# Patient Record
Sex: Female | Born: 1955 | Race: White | Hispanic: No | Marital: Married | Smoking: Never smoker
Health system: Southern US, Community
[De-identification: ages and names within clinical notes are randomized; demographics above are authoritative.]

## PROBLEM LIST (undated history)

## (undated) DIAGNOSIS — K219 Gastro-esophageal reflux disease without esophagitis: Secondary | ICD-10-CM

## (undated) DIAGNOSIS — K5792 Diverticulitis of intestine, part unspecified, without perforation or abscess without bleeding: Secondary | ICD-10-CM

## (undated) DIAGNOSIS — R7309 Other abnormal glucose: Secondary | ICD-10-CM

## (undated) DIAGNOSIS — E785 Hyperlipidemia, unspecified: Secondary | ICD-10-CM

## (undated) DIAGNOSIS — G479 Sleep disorder, unspecified: Secondary | ICD-10-CM

## (undated) DIAGNOSIS — M1990 Unspecified osteoarthritis, unspecified site: Secondary | ICD-10-CM

## (undated) DIAGNOSIS — N950 Postmenopausal bleeding: Secondary | ICD-10-CM

## (undated) DIAGNOSIS — I10 Essential (primary) hypertension: Secondary | ICD-10-CM

## (undated) DIAGNOSIS — F419 Anxiety disorder, unspecified: Secondary | ICD-10-CM

## (undated) DIAGNOSIS — N898 Other specified noninflammatory disorders of vagina: Secondary | ICD-10-CM

## (undated) HISTORY — PX: REPLACEMENT TOTAL KNEE BILATERAL: SUR1225

## (undated) HISTORY — DX: Other abnormal glucose: R73.09

## (undated) HISTORY — DX: Sleep disorder, unspecified: G47.9

## (undated) HISTORY — DX: Postmenopausal bleeding: N95.0

## (undated) HISTORY — PX: TUBAL LIGATION: SHX77

## (undated) HISTORY — DX: Hyperlipidemia, unspecified: E78.5

## (undated) HISTORY — DX: Other specified noninflammatory disorders of vagina: N89.8

## (undated) HISTORY — PX: TONSILLECTOMY: SUR1361

## (undated) HISTORY — DX: Essential (primary) hypertension: I10

## (undated) HISTORY — DX: Diverticulitis of intestine, part unspecified, without perforation or abscess without bleeding: K57.92

---

## 2002-08-31 ENCOUNTER — Emergency Department (HOSPITAL_COMMUNITY): Admission: EM | Admit: 2002-08-31 | Discharge: 2002-08-31 | Payer: Self-pay | Admitting: Emergency Medicine

## 2007-02-13 ENCOUNTER — Ambulatory Visit (HOSPITAL_COMMUNITY): Admission: RE | Admit: 2007-02-13 | Discharge: 2007-02-13 | Payer: Self-pay | Admitting: Family Medicine

## 2007-09-16 ENCOUNTER — Ambulatory Visit (HOSPITAL_COMMUNITY): Admission: RE | Admit: 2007-09-16 | Discharge: 2007-09-16 | Payer: Self-pay | Admitting: Endocrinology

## 2007-09-29 ENCOUNTER — Other Ambulatory Visit: Admission: RE | Admit: 2007-09-29 | Discharge: 2007-09-29 | Payer: Self-pay | Admitting: Obstetrics and Gynecology

## 2007-11-05 ENCOUNTER — Ambulatory Visit (HOSPITAL_COMMUNITY): Admission: RE | Admit: 2007-11-05 | Discharge: 2007-11-05 | Payer: Self-pay | Admitting: Internal Medicine

## 2007-11-05 ENCOUNTER — Ambulatory Visit: Payer: Self-pay | Admitting: Internal Medicine

## 2008-07-15 ENCOUNTER — Emergency Department (HOSPITAL_COMMUNITY): Admission: EM | Admit: 2008-07-15 | Discharge: 2008-07-15 | Payer: Self-pay | Admitting: Emergency Medicine

## 2008-11-26 ENCOUNTER — Emergency Department (HOSPITAL_COMMUNITY): Admission: EM | Admit: 2008-11-26 | Discharge: 2008-11-26 | Payer: Self-pay | Admitting: Emergency Medicine

## 2009-01-09 ENCOUNTER — Ambulatory Visit (HOSPITAL_COMMUNITY): Admission: RE | Admit: 2009-01-09 | Discharge: 2009-01-09 | Payer: Self-pay | Admitting: Internal Medicine

## 2009-09-20 ENCOUNTER — Other Ambulatory Visit: Admission: RE | Admit: 2009-09-20 | Discharge: 2009-09-20 | Payer: Self-pay | Admitting: Obstetrics and Gynecology

## 2009-11-01 ENCOUNTER — Ambulatory Visit (HOSPITAL_COMMUNITY): Admission: RE | Admit: 2009-11-01 | Discharge: 2009-11-01 | Payer: Self-pay | Admitting: Obstetrics & Gynecology

## 2010-04-29 ENCOUNTER — Encounter: Payer: Self-pay | Admitting: Endocrinology

## 2010-04-30 ENCOUNTER — Encounter: Payer: Self-pay | Admitting: Endocrinology

## 2010-07-14 LAB — CBC
HCT: 42.3 % (ref 36.0–46.0)
MCHC: 35.3 g/dL (ref 30.0–36.0)
RBC: 4.84 MIL/uL (ref 3.87–5.11)
RDW: 13.3 % (ref 11.5–15.5)
WBC: 13.7 10*3/uL — ABNORMAL HIGH (ref 4.0–10.5)

## 2010-07-14 LAB — BASIC METABOLIC PANEL
Calcium: 9.7 mg/dL (ref 8.4–10.5)
GFR calc non Af Amer: >60 mL/min (ref >60)
Glucose, Bld: 130 mg/dL — ABNORMAL HIGH (ref 70–99)
Potassium: 3.6 mEq/L (ref 3.5–5.1)
Sodium: 134 mEq/L — ABNORMAL LOW (ref 135–145)

## 2010-07-14 LAB — DIFFERENTIAL
Basophils Absolute: 0 10*3/uL (ref 0.0–0.1)
Eosinophils Relative: 0 % (ref 0–5)
Lymphs Abs: 0.9 10*3/uL (ref 0.7–4.0)
Neutrophils Relative %: 86 % — ABNORMAL HIGH (ref 43–77)

## 2010-08-24 NOTE — Op Note (Signed)
NAME:  Courtney Marshall, Courtney Marshall               ACCOUNT NO.:  [id number]   MEDICAL RECORD NO.:  [id number]          PATIENT TYPE:  AMB   LOCATION:  DAY                           FACILITY:  [facility]   PHYSICIAN:  R. Roetta Sessions, M.D. DATE OF BIRTH:  Apr 06, 1956   DATE OF PROCEDURE:  DATE OF DISCHARGE:                               OPERATIVE REPORT   Courtney Marshall is a 55 year old lady sent over out of the courtesy of Dr.  Assunta Found for colorectal cancer and she has never had lower GI tract  evaluated.  There is no family history of colorectal neoplasia.  She is  devoid of any lower GI tract symptoms.  Colonoscopy is now being done  with a screening maneuver.  Risks, benefits, alternatives, and  limitations have been reviewed, questions answered, all parties  agreeable.  Please see documentation in the medical record.   PROCEDURE NOTE:  Oxygen saturation, blood pressure, and pulse of the  patient monitored throughout the entire procedure.   CONSCIOUS SEDATION:  Versed 7 mg , IV Demerol 100 mg IV in divided  doses.   INSTRUMENT:  Pentax video chip system.   FINDINGS:  Digital rectal exam revealed no abnormalities.  Endoscopic  findings:  The prep was adequate.  Colon:  Colonic mucosa was surveyed  from the rectosigmoid junction through the left transverse, right colon,  appendiceal orifice, ileocecal valve, and cecum.  These structures were  well seen and photographed for the record.  From this level, the scope  was slowly and cautiously withdrawn.  All previously mentioned mucosal  surfaces were again seen.  The patient did have a left-sided  diverticulum and colonic mucosa appeared normal.  The scope was pulled  down the rectum with thorough examination of the rectal mucosa including  retroflex view of the anal verge demonstrated no abnormalities. The  patient tolerated the procedure well and was reacted in endoscopy.   IMPRESSION:  Normal rectum, left-sided diverticulum, and colonic mucosa  appeared normal.   RECOMMENDATIONS:  Repeat screening colonoscopy in 10 years.      Jonathon Bellows, M.D.  Electronically Signed     RMR/MEDQ  D:  12/07/2007  T:  12/08/2007  Job:  [id number]   cc:   Corrie Mckusick, M.D.  Fax: [phone]

## 2011-02-14 ENCOUNTER — Other Ambulatory Visit: Payer: Self-pay | Admitting: Dermatology

## 2012-02-28 ENCOUNTER — Other Ambulatory Visit: Payer: Self-pay | Admitting: Dermatology

## 2012-03-11 ENCOUNTER — Other Ambulatory Visit (HOSPITAL_COMMUNITY)
Admission: RE | Admit: 2012-03-11 | Discharge: 2012-03-11 | Disposition: A | Discharge status: Home / Self Care | Payer: BC Managed Care – PPO | Source: Ambulatory Visit | Attending: Obstetrics and Gynecology | Admitting: Obstetrics and Gynecology

## 2012-03-11 ENCOUNTER — Other Ambulatory Visit: Payer: Self-pay | Admitting: Adult Health

## 2012-03-11 DIAGNOSIS — Z1151 Encounter for screening for human papillomavirus (HPV): Secondary | ICD-10-CM | POA: Insufficient documentation

## 2012-03-11 DIAGNOSIS — Z01419 Encounter for gynecological examination (general) (routine) without abnormal findings: Secondary | ICD-10-CM | POA: Insufficient documentation

## 2013-10-21 ENCOUNTER — Other Ambulatory Visit: Payer: Self-pay | Admitting: Adult Health

## 2013-10-25 ENCOUNTER — Encounter: Payer: Self-pay | Admitting: Adult Health

## 2013-10-25 ENCOUNTER — Ambulatory Visit (INDEPENDENT_AMBULATORY_CARE_PROVIDER_SITE_OTHER): Payer: BC Managed Care – PPO | Admitting: Adult Health

## 2013-10-25 VITALS — BP 150/80 | HR 78 | Ht 64.0 in | Wt 185.0 lb

## 2013-10-25 DIAGNOSIS — Z1212 Encounter for screening for malignant neoplasm of rectum: Secondary | ICD-10-CM

## 2013-10-25 DIAGNOSIS — Z01419 Encounter for gynecological examination (general) (routine) without abnormal findings: Secondary | ICD-10-CM

## 2013-10-25 LAB — HEMOCCULT GUIAC POC 1CARD (OFFICE): FECAL OCCULT BLD: NEGATIVE

## 2013-10-25 NOTE — Progress Notes (Addendum)
Patient ID: Norville HaggardCathy S Tiller, female   DOB: [birth year], 58 y.o.   MRN: [id number] History of Present Illness: Lynden AngCathy is a [age] year old white female, married in for a physical,she had a normal pap with negative HPV 03/11/12.She has had decrease libido and saw [facility] in [place] and started an estradiol patch and Prometrium and feels better,still with vaginal dryness.   Current Medications, Allergies, Past Medical History, Past Surgical History, Family History and Social History were reviewed in [name] electronic medical record.     Review of Systems: Patient denies any headaches, blurred vision, shortness of breath, chest pain, abdominal pain, problems with bowel movements, urination, or joint pain or mood swings.See HPI    Physical Exam:BP 150/80  Pulse 78  Ht 5\' 4"  (1.626 m)  Wt 185 lb (83.915 kg)  BMI 31.74 kg/m2 General:  Well developed, well nourished, no acute distress Skin:  Warm and dry Neck:  Midline trachea, normal thyroid Lungs; Clear to auscultation bilaterally Breast:  No dominant palpable mass, retraction, or nipple discharge Cardiovascular: Regular rate and rhythm Abdomen:  Soft, non tender, no hepatosplenomegaly Pelvic:  External genitalia is normal in appearance.  The vagina is pale with decrease rugae and moisture. The cervix is atrophic.  Uterus is felt to be normal size, shape, and contour.  No                adnexal masses or tenderness noted. Rectal: Good sphincter tone, no polyps, or hemorrhoids felt.  Hemoccult negative. Extremities:  No swelling or varicosities noted Psych:  No mood changes, alert and cooperative,seems happy She had labs recently and is awaiting results.Had [place] mamm[place] in [place].   Impression: Yearly gyn exam no pap    Plan: Physical and pap in 1 year Mammogram yearly Colonoscopy 2019 Try Luvena for vaginal moisture Review handouts on menopause and HRT

## 2013-10-25 NOTE — Patient Instructions (Signed)
Hormone Therapy At menopause, your body begins making less estrogen and progesterone hormones. This causes the body to stop having menstrual periods. This is because estrogen and progesterone hormones control your periods and menstrual cycle. A lack of estrogen may cause symptoms such as:  Hot flushes (or hot flashes).  Vaginal dryness.  Dry skin.  Loss of sex drive.  Risk of bone loss (osteoporosis). When this happens, you may choose to take hormone therapy to get back the estrogen lost during menopause. When the hormone estrogen is given alone, it is usually referred to as ET (Estrogen Therapy). When the hormone progestin is combined with estrogen, it is generally called HT (Hormone Therapy). This was formerly known as hormone replacement therapy (HRT). Your caregiver can help you make a decision on what will be best for you. The decision to use HT seems to change often as new studies are done. Many studies do not agree on the benefits of hormone replacement therapy. LIKELY BENEFITS OF HT INCLUDE PROTECTION FROM:  Hot Flushes (also called hot flashes) - A hot flush is a sudden feeling of heat that spreads over the face and body. The skin may redden like a blush. It is connected with sweats and sleep disturbance. Women going through menopause may have hot flushes a few times a month or several times per day depending on the woman.  Osteoporosis (bone loss)- Estrogen helps guard against bone loss. After menopause, a woman's bones slowly lose calcium and become weak and brittle. As a result, bones are more likely to break. The hip, wrist, and spine are affected most often. Hormone therapy can help slow bone loss after menopause. Weight bearing exercise and taking calcium with vitamin D also can help prevent bone loss. There are also medications that your caregiver can prescribe that can help prevent osteoporosis.  Vaginal Dryness - Loss of estrogen causes changes in the vagina. Its lining may  become thin and dry. These changes can cause pain and bleeding during sexual intercourse. Dryness can also lead to infections. This can cause burning and itching. (Vaginal estrogen treatment can help relieve pain, itching, and dryness.)  Urinary Tract Infections are more common after menopause because of lack of estrogen. Some women also develop urinary incontinence because of low estrogen levels in the vagina and bladder.  Possible other benefits of estrogen include a positive effect on mood and short-term memory in women. RISKS AND COMPLICATIONS  Using estrogen alone without progesterone causes the lining of the uterus to grow. This increases the risk of lining of the uterus (endometrial) cancer. Your caregiver should give another hormone called progestin if you have a uterus.  Women who take combined (estrogen and progestin) HT appear to have an increased risk of breast cancer. The risk appears to be small, but increases throughout the time that HT is taken.  Combined therapy also makes the breast tissue slightly denser which makes it harder to read mammograms (breast X-rays).  Combined, estrogen and progesterone therapy can be taken together every day, in which case there may be spotting of blood. HT therapy can be taken cyclically in which case you will have menstrual periods. Cyclically means HT is taken for a set amount of days, then not taken, then this process is repeated.  HT may increase the risk of stroke, heart attack, breast cancer and forming blood clots in your leg.  Transdermal estrogen (estrogen that is absorbed through the skin with a patch or a cream) may have more positive results with:    Cholesterol.  Blood pressure.  Blood clots. Having the following conditions may indicate you should not have HT:  Endometrial cancer.  Liver disease.  Breast cancer.  Heart disease.  History of blood clots.  Stroke. TREATMENT   If you choose to take HT and have a uterus,  usually estrogen and progestin are prescribed.  Your caregiver will help you decide the best way to take the medications.  Possible ways to take estrogen include:  Pills.  Patches.  Gels.  Sprays.  Vaginal estrogen cream, rings and tablets.  It is best to take the lowest dose possible that will help your symptoms and take them for the shortest period of time that you can.  Hormone therapy can help relieve some of the problems (symptoms) that affect women at menopause. Before making a decision about HT, talk to your caregiver about what is best for you. Be well informed and comfortable with your decisions. HOME CARE INSTRUCTIONS   Follow your caregivers advice when taking the medications.  A Pap test is done to screen for cervical cancer.  The first Pap test should be done at age 21.  Between ages 21 and 29, Pap tests are repeated every 2 years.  Beginning at age 30, you are advised to have a Pap test every 3 years as long as your past 3 Pap tests have been normal.  Some women have medical problems that increase the chance of getting cervical cancer. Talk to your caregiver about these problems. It is especially important to talk to your caregiver if a new problem develops soon after your last Pap test. In these cases, your caregiver may recommend more frequent screening and Pap tests.  The above recommendations are the same for women who have or have not gotten the vaccine for HPV (Human Papillomavirus).  If you had a hysterectomy for a problem that was not a cancer or a condition that could lead to cancer, then you no longer need Pap tests. However, even if you no longer need a Pap test, a regular exam is a good idea to make sure no other problems are starting.   If you are between ages 65 and 70, and you have had normal Pap tests going back 10 years, you no longer need Pap tests. However, even if you no longer need a Pap test, a regular exam is a good idea to make sure no  other problems are starting.   If you have had past treatment for cervical cancer or a condition that could lead to cancer, you need Pap tests and screening for cancer for at least 20 years after your treatment.  If Pap tests have been discontinued, risk factors (such as a new sexual partner) need to be re-assessed to determine if screening should be resumed.  Some women may need screenings more often if they are at high risk for cervical cancer.  Get mammograms done as per the advice of your caregiver. SEEK IMMEDIATE MEDICAL CARE IF:  You develop abnormal vaginal bleeding.  You have pain or swelling in your legs, shortness of breath, or chest pain.  You develop dizziness or headaches.  You have lumps or changes in your breasts or armpits.  You have slurred speech.  You develop weakness or numbness of your arms or legs.  You have pain, burning, or bleeding when urinating.  You develop abdominal pain. Document Released: 12/22/2002 Document Revised: 06/17/2011 Document Reviewed: 04/11/2010 ExitCare Patient Information 2015 [employer]. This information is not intended to   replace advice given to you by your health care provider. Make sure you discuss any questions you have with your health care provider. Menopause Menopause is the normal time of life when menstrual periods stop completely. Menopause is complete when you have missed 12 consecutive menstrual periods. It usually occurs between the ages of 1 years and 58 years. Very rarely does a woman develop menopause before the age of 42 years. At menopause, your ovaries stop producing the female hormones estrogen and progesterone. This can cause undesirable symptoms and also affect your health. Sometimes the symptoms may occur 4-5 years before the menopause begins. There is no relationship between menopause and:  Oral contraceptives.  Number of children you had.  Race.  The age your menstrual periods started  (menarche). Heavy smokers and very thin women may develop menopause earlier in life. CAUSES  The ovaries stop producing the female hormones estrogen and progesterone.  Other causes include:  Surgery to remove both ovaries.  The ovaries stop functioning for no known reason.  Tumors of the pituitary gland in the brain.  Medical disease that affects the ovaries and hormone production.  Radiation treatment to the abdomen or pelvis.  Chemotherapy that affects the ovaries. SYMPTOMS   Hot flashes.  Night sweats.  Decrease in sex drive.  Vaginal dryness and thinning of the vagina causing painful intercourse.  Dryness of the skin and developing wrinkles.  Headaches.  Tiredness.  Irritability.  Memory problems.  Weight gain.  Bladder infections.  Hair growth of the face and chest.  Infertility. More serious symptoms include:  Loss of bone (osteoporosis) causing breaks (fractures).  Depression.  Hardening and narrowing of the arteries (atherosclerosis) causing heart attacks and strokes. DIAGNOSIS   When the menstrual periods have stopped for 12 straight months.  Physical exam.  Hormone studies of the blood. TREATMENT  There are many treatment choices and nearly as many questions about them. The decisions to treat or not to treat menopausal changes is an individual choice made with your health care provider. Your health care provider can discuss the treatments with you. Together, you can decide which treatment will work best for you. Your treatment choices may include:   Hormone therapy (estrogen and progesterone).  Non-hormonal medicines.  Treating the individual symptoms with medicine (for example antidepressants for depression).  Herbal medicines that may help specific symptoms.  Counseling by a psychiatrist or psychologist.  Group therapy.  Lifestyle changes including:  Eating healthy.  Regular exercise.  Limiting caffeine and  alcohol.  Stress management and meditation.  No treatment. HOME CARE INSTRUCTIONS   Take the medicine your health care provider gives you as directed.  Get plenty of sleep and rest.  Exercise regularly.  Eat a diet that contains calcium (good for the bones) and soy products (acts like estrogen hormone).  Avoid alcoholic beverages.  Do not smoke.  If you have hot flashes, dress in layers.  Take supplements, calcium, and vitamin D to strengthen bones.  You can use over-the-counter lubricants or moisturizers for vaginal dryness.  Group therapy is sometimes very helpful.  Acupuncture may be helpful in some cases. SEEK MEDICAL CARE IF:   You are not sure you are in menopause.  You are having menopausal symptoms and need advice and treatment.  You are still having menstrual periods after age 50 years.  You have pain with intercourse.  Menopause is complete (no menstrual period for 12 months) and you develop vaginal bleeding.  You need a referral to a specialist (  gynecologist, psychiatrist, or psychologist) for treatment. SEEK IMMEDIATE MEDICAL CARE IF:   You have severe depression.  You have excessive vaginal bleeding.  You fell and think you have a broken bone.  You have pain when you urinate.  You develop leg or chest pain.  You have a fast pounding heart beat (palpitations).  You have severe headaches.  You develop vision problems.  You feel a lump in your breast.  You have abdominal pain or severe indigestion. Document Released: 06/15/2003 Document Revised: 11/25/2012 Document Reviewed: 10/22/2012 [facility] Patient Information 2015 [employer] [place]. This information is not intended to replace advice given to you by your health care provider. Make sure you discuss any questions you have with your health care provider. Physical in 1 year Mammogram yearly Colonoscopy 2019

## 2014-02-07 ENCOUNTER — Encounter: Payer: Self-pay | Admitting: Adult Health

## 2014-06-30 ENCOUNTER — Encounter (HOSPITAL_COMMUNITY): Payer: Self-pay | Admitting: Emergency Medicine

## 2014-06-30 ENCOUNTER — Emergency Department (HOSPITAL_COMMUNITY)
Admission: EM | Admit: 2014-06-30 | Discharge: 2014-06-30 | Disposition: A | Discharge status: Home / Self Care | Payer: BC Managed Care – PPO | Attending: Emergency Medicine | Admitting: Emergency Medicine

## 2014-06-30 ENCOUNTER — Emergency Department (HOSPITAL_COMMUNITY): Payer: BC Managed Care – PPO

## 2014-06-30 DIAGNOSIS — I10 Essential (primary) hypertension: Secondary | ICD-10-CM | POA: Diagnosis not present

## 2014-06-30 DIAGNOSIS — R42 Dizziness and giddiness: Secondary | ICD-10-CM | POA: Insufficient documentation

## 2014-06-30 DIAGNOSIS — Z79899 Other long term (current) drug therapy: Secondary | ICD-10-CM | POA: Diagnosis not present

## 2014-06-30 DIAGNOSIS — R51 Headache: Secondary | ICD-10-CM | POA: Insufficient documentation

## 2014-06-30 DIAGNOSIS — Z8639 Personal history of other endocrine, nutritional and metabolic disease: Secondary | ICD-10-CM | POA: Insufficient documentation

## 2014-06-30 DIAGNOSIS — R55 Syncope and collapse: Secondary | ICD-10-CM | POA: Diagnosis present

## 2014-06-30 LAB — CBC WITH DIFFERENTIAL/PLATELET
BASOS ABS: 0 10*3/uL (ref 0.0–0.1)
BASOS PCT: 1 % (ref 0–1)
EOS ABS: 0.3 10*3/uL (ref 0.0–0.7)
EOS PCT: 5 % (ref 0–5)
HEMATOCRIT: 42.7 % (ref 36.0–46.0)
Hemoglobin: 14.6 g/dL (ref 12.0–15.0)
Lymphocytes Relative: 38 % (ref 12–46)
Lymphs Abs: 2.3 10*3/uL (ref 0.7–4.0)
MCH: 30.5 pg (ref 26.0–34.0)
MCHC: 34.2 g/dL (ref 30.0–36.0)
MCV: 89.1 fL (ref 78.0–100.0)
MONO ABS: 0.6 10*3/uL (ref 0.1–1.0)
Monocytes Relative: 10 % (ref 3–12)
Neutro Abs: 2.8 10*3/uL (ref 1.7–7.7)
Neutrophils Relative %: 46 % (ref 43–77)
PLATELETS: 222 10*3/uL (ref 150–400)
RBC: 4.79 MIL/uL (ref 3.87–5.11)
RDW: 12.7 % (ref 11.5–15.5)
WBC: 6 10*3/uL (ref 4.0–10.5)

## 2014-06-30 LAB — COMPREHENSIVE METABOLIC PANEL
ALK PHOS: 62 U/L (ref 39–117)
ALT: 23 U/L (ref 0–35)
ANION GAP: 9 (ref 5–15)
AST: 23 U/L (ref 0–37)
Albumin: 4.4 g/dL (ref 3.5–5.2)
BILIRUBIN TOTAL: 0.4 mg/dL (ref 0.3–1.2)
BUN: 16 mg/dL (ref 6–23)
CALCIUM: 9.6 mg/dL (ref 8.4–10.5)
CHLORIDE: 104 mmol/L (ref 96–112)
CO2: 27 mmol/L (ref 19–32)
CREATININE: 0.6 mg/dL (ref 0.50–1.10)
GFR calc Af Amer: >90 mL/min (ref >90)
GFR calc non Af Amer: >90 mL/min (ref >90)
GLUCOSE: 112 mg/dL — AB (ref 70–99)
Potassium: 3.3 mmol/L — ABNORMAL LOW (ref 3.5–5.1)
Sodium: 140 mmol/L (ref 135–145)
Total Protein: 7.4 g/dL (ref 6.0–8.3)

## 2014-06-30 LAB — CBG MONITORING, ED: GLUCOSE-CAPILLARY: 106 mg/dL — AB (ref 70–99)

## 2014-06-30 MED ORDER — MECLIZINE HCL 12.5 MG PO TABS
25.0000 mg | ORAL_TABLET | Freq: Once | ORAL | Status: AC
  Administered 2014-06-30: 25 mg via ORAL
  Filled 2014-06-30: qty 2

## 2014-06-30 MED ORDER — MECLIZINE HCL 25 MG PO TABS: 25.0000 mg | ORAL_TABLET | Freq: Three times a day (TID) | ORAL | Status: DC | PRN

## 2014-06-30 MED ORDER — ONDANSETRON HCL 4 MG/2ML IJ SOLN
4.0000 mg | Freq: Once | INTRAMUSCULAR | Status: AC
  Administered 2014-06-30: 4 mg via INTRAVENOUS
  Filled 2014-06-30: qty 2

## 2014-06-30 MED ORDER — MORPHINE SULFATE 4 MG/ML IJ SOLN
4.0000 mg | Freq: Once | INTRAMUSCULAR | Status: DC
Start: 2014-06-30 — End: 2014-06-30
  Filled 2014-06-30: qty 1

## 2014-06-30 NOTE — ED Notes (Signed)
PT c/o sudden onset of dizziness with a syncopal episode and fell onto the grass today at 1115. PT c/o headache at this time and dizziness with movement.

## 2014-06-30 NOTE — ED Notes (Signed)
Patient ambulatory to restroom, steady gait.

## 2014-06-30 NOTE — ED Notes (Signed)
Patient with no complaints at this time. Respirations even and unlabored. Skin warm/dry. Discharge instructions reviewed with patient at this time. Patient given opportunity to voice concerns/ask questions. IV removed per policy and band-aid applied to site. Patient discharged at this time and left Emergency Department with steady gait.  

## 2014-06-30 NOTE — ED Provider Notes (Signed)
CSN: [id number]     Arrival date & time 06/30/14  1245 History   First MD Initiated Contact with Patient 06/30/14 1319     Chief Complaint  Patient presents with  . Near Syncope     HPI  His residual evaluation of a dizzy spell. She is a Runner, broadcasting/film/video. She was walking outside of school with [employer]. Shows sudden onset of severe spinning sensation. She developed on the ground "on my butt". Mild nausea. Her symptoms improved. She sat on the ground. The school "safety team" put her in a wheelchair. She was taken to the office. Her husband came to get her and brought her to the hospital. She's been minimally symptomatic since leaving school. No past episodes of vertigo. She's had a headache each day for the last year or 2. She attributes this to "stress".  Past Medical History  Diagnosis Date  . Hypertension   . Hyperlipidemia    Past Surgical History  Procedure Laterality Date  . Cesarean section    . Tubal ligation    . Tonsillectomy     Family History  Problem Relation Age of Onset  . Osteoporosis Mother   . Heart disease Father   . Hypertension Father   . Hyperlipidemia Father   . Osteoporosis Sister   . Osteoporosis Maternal Grandmother   . Other Maternal Grandfather     hit by train  . Heart attack Paternal Grandfather    History  Substance Use Topics  . Smoking status: Never Smoker   . Smokeless tobacco: Never Used  . Alcohol Use: No   OB History    Gravida Para Term Preterm AB TAB SAB Ectopic Multiple Living   Review of Systems  Constitutional: Negative for fever, chills, diaphoresis, appetite change and fatigue.  HENT: Negative for mouth sores, sore throat and trouble swallowing.   Eyes: Negative for visual disturbance.  Respiratory: Negative for cough, chest tightness, shortness of breath and wheezing.   Cardiovascular: Negative for chest pain.  Gastrointestinal: Negative for nausea, vomiting, abdominal pain, diarrhea and abdominal distention.   Endocrine: Negative for polydipsia, polyphagia and polyuria.  Genitourinary: Negative for dysuria, frequency and hematuria.  Musculoskeletal: Negative for gait problem.  Skin: Negative for color change, pallor and rash.  Neurological: Positive for dizziness and headaches. Negative for syncope and light-headedness.  Hematological: Does not bruise/bleed easily.  Psychiatric/Behavioral: Negative for behavioral problems and confusion.      Allergies  Lisinopril  Home Medications   Prior to Admission medications   Medication Sig Start Date End Date Taking? Authorizing Provider  buPROPion (WELLBUTRIN XL) 150 MG 24 hr tablet 150 mg. 06/20/14  Yes Historical Provider, MD  losartan-hydrochlorothiazide (HYZAAR) 100-25 MG per tablet  09/25/13  Yes Historical Provider, MD   BP 135/63 mmHg  Pulse 78  Temp(Src) 98.1 F (36.7 C) (Oral)  Resp 18  Ht  (1.626 m)  Wt 185 lb (83.915 kg)  BMI 31.74 kg/m2  SpO2 96% Physical Exam  Constitutional: She is oriented to person, place, and time. She appears well-developed and well-nourished. No distress.  HENT:  Head: Normocephalic.  Eyes: Conjunctivae are normal. Pupils are equal, round, and reactive to light. No scleral icterus.  Neck: Normal range of motion. Neck supple. No thyromegaly present.  Cardiovascular: Normal rate and regular rhythm.  Exam reveals no gallop and no friction rub.   No murmur heard. Pulmonary/Chest: Effort normal and breath sounds normal.  No respiratory distress. She has no wheezes. She has no rales.  Abdominal: Soft. Bowel sounds are normal. She exhibits no distension. There is no tenderness. There is no rebound.  Musculoskeletal: Normal range of motion.  Neurological: She is alert and oriented to person, place, and time.  She states with changing positions or moving her head she gets "a few" episodes where she thought she is spinning. No demonstrable nystagmus. Normal cranial nerves per normal peripheral strength and  sensation and gait.  Skin: Skin is warm and dry. No rash noted.  Psychiatric: She has a normal mood and affect. Her behavior is normal.    ED Course  Procedures (including critical care time) Labs Review Labs Reviewed  COMPREHENSIVE METABOLIC PANEL - Abnormal; Notable for the following:    Potassium 3.3 (*)    Glucose, Bld 112 (*)    All other components within normal limits  CBG MONITORING, ED - Abnormal; Notable for the following:    Glucose-Capillary 106 (*)    All other components within normal limits  CBC WITH DIFFERENTIAL/PLATELET    Imaging Review No results found.   EKG Interpretation   Date/Time:  Thursday June 30 2014 13:00:16 EDT Ventricular Rate:  74 PR Interval:  135 QRS Duration: 95 QT Interval:  380 QTC Calculation: 422 R Axis:   6 Text Interpretation:  Sinus rhythm Low voltage, precordial leads Confirmed  by Fayrene FearingJAMES  MD, Briany Aye ([id number]) on 06/30/2014 2:44:37 PM      MDM   Final diagnoses:  Vertigo    Patient minimally symptomatically. Mild headache. Vertigo only with movements. She is a history of hypertension but is controlled with medications. Despite markedly hypertensive here. Nonsmoker. Has a family history of suturable aneurysm or AVM. No neck pain or injury to suggest dissection.  This was an episode of acute peripheral vertigo.  She has headache today. However, she's had a headache she states everyday for about the last year or 2. Has total) neurological findings on exam. No acute findings on CT scan. I think she would be appropriate for outpatient treatment. Vertigo precautions. Meclizine. Take meclizine until 24 hours of symptoms. No driving until 24 hours without symptoms. Primary care follow-up.    Rolland PorterMark Donnavin Vandenbrink, MD 06/30/14 [phone]

## 2014-06-30 NOTE — Discharge Instructions (Signed)
Take meclizine/Antivert until 24 hours without any dizziness. No driving until 24 hours without symptoms. Follow-up with your primary care physician.   Benign Positional Vertigo Vertigo means you feel like you or your surroundings are moving when they are not. Benign positional vertigo is the most common form of vertigo. Benign means that the cause of your condition is not serious. Benign positional vertigo is more common in older adults. CAUSES  Benign positional vertigo is the result of an upset in the labyrinth system. This is an area in the middle ear that helps control your balance. This may be caused by a viral infection, head injury, or repetitive motion. However, often no specific cause is found. SYMPTOMS  Symptoms of benign positional vertigo occur when you move your head or eyes in different directions. Some of the symptoms may include:  Loss of balance and falls.  Vomiting.  Blurred vision.  Dizziness.  Nausea.  Involuntary eye movements (nystagmus). DIAGNOSIS  Benign positional vertigo is usually diagnosed by physical exam. If the specific cause of your benign positional vertigo is unknown, your caregiver may perform imaging tests, such as magnetic resonance imaging (MRI) or computed tomography (CT). TREATMENT  Your caregiver may recommend movements or procedures to correct the benign positional vertigo. Medicines such as meclizine, benzodiazepines, and medicines for nausea may be used to treat your symptoms. In rare cases, if your symptoms are caused by certain conditions that affect the inner ear, you may need surgery. HOME CARE INSTRUCTIONS   Follow your caregiver's instructions.  Move slowly. Do not make sudden body or head movements.  Avoid driving.  Avoid operating heavy machinery.  Avoid performing any tasks that would be dangerous to you or others during a vertigo episode.  Drink enough fluids to keep your urine clear or pale yellow. SEEK IMMEDIATE MEDICAL  CARE IF:   You develop problems with walking, weakness, numbness, or using your arms, hands, or legs.  You have difficulty speaking.  You develop severe headaches.  Your nausea or vomiting continues or gets worse.  You develop visual changes.  Your family or friends notice any behavioral changes.  Your condition gets worse.  You have a fever.  You develop a stiff neck or sensitivity to light. MAKE SURE YOU:   Understand these instructions.  Will watch your condition.  Will get help right away if you are not doing well or get worse. Document Released: 12/31/2005 Document Revised: 06/17/2011 Document Reviewed: 12/13/2010 [facility] Patient Information 2015 [employer] [place]. This information is not intended to replace advice given to you by your health care provider. Make sure you discuss any questions you have with your health care provider.

## 2014-06-30 NOTE — ED Notes (Signed)
MD at bedside. 

## 2014-06-30 NOTE — ED Notes (Signed)
Swallow screen done before given PO medication. Passed with no difficulty.

## 2014-11-03 ENCOUNTER — Other Ambulatory Visit (HOSPITAL_COMMUNITY)
Admission: RE | Admit: 2014-11-03 | Discharge: 2014-11-03 | Disposition: A | Discharge status: Home / Self Care | Payer: BC Managed Care – PPO | Source: Ambulatory Visit | Attending: Adult Health | Admitting: Adult Health

## 2014-11-03 ENCOUNTER — Ambulatory Visit (INDEPENDENT_AMBULATORY_CARE_PROVIDER_SITE_OTHER): Payer: BC Managed Care – PPO | Admitting: Adult Health

## 2014-11-03 ENCOUNTER — Encounter: Payer: Self-pay | Admitting: Adult Health

## 2014-11-03 VITALS — BP 122/76 | HR 80 | Ht 64.0 in | Wt 186.0 lb

## 2014-11-03 DIAGNOSIS — Z01419 Encounter for gynecological examination (general) (routine) without abnormal findings: Secondary | ICD-10-CM | POA: Insufficient documentation

## 2014-11-03 DIAGNOSIS — Z1151 Encounter for screening for human papillomavirus (HPV): Secondary | ICD-10-CM | POA: Insufficient documentation

## 2014-11-03 DIAGNOSIS — N950 Postmenopausal bleeding: Secondary | ICD-10-CM

## 2014-11-03 DIAGNOSIS — G479 Sleep disorder, unspecified: Secondary | ICD-10-CM

## 2014-11-03 DIAGNOSIS — Z1211 Encounter for screening for malignant neoplasm of colon: Secondary | ICD-10-CM | POA: Diagnosis not present

## 2014-11-03 DIAGNOSIS — N898 Other specified noninflammatory disorders of vagina: Secondary | ICD-10-CM

## 2014-11-03 HISTORY — DX: Sleep disorder, unspecified: G47.9

## 2014-11-03 HISTORY — DX: Postmenopausal bleeding: N95.0

## 2014-11-03 HISTORY — DX: Other specified noninflammatory disorders of vagina: N89.8

## 2014-11-03 LAB — HEMOCCULT GUIAC POC 1CARD (OFFICE): Fecal Occult Blood, POC: NEGATIVE

## 2014-11-03 NOTE — Patient Instructions (Signed)
Physical in  1 year Mammogram yearly  Return 8/8 for gyn [place] Colonoscopy next year Try melatonin at bedtime  Try luvena for moisture Use astroglide with sex

## 2014-11-03 NOTE — Progress Notes (Signed)
Patient ID: Courtney Marshall, female   DOB: Aug 19, 1955, 59 y.o.   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 PCP is Dr Phillips Odor.  Current Medications, Allergies, Past Medical History, Past Surgical History, Family History and Social History were reviewed in [employer] record.     Review of Systems: Patient denies any headaches, hearing loss, fatigue, blurred vision, shortness of breath, chest pain, abdominal pain, problems with bowel movements, urination, or intercourse. No joint pain or mood swings.See HPI for positives.    Physical Exam:BP 122/76 mmHg  Pulse 80  Ht  (1.626 m)  Wt 186 lb (84.369 kg)  BMI 31.91 kg/m2 General:  Well developed, well nourished, no acute distress Skin:  Warm and dry Neck:  Midline trachea, normal thyroid, good ROM, no lymphadenopathy, no carotid bruits heard Lungs; Clear to auscultation bilaterally Breast:  No dominant palpable mass, retraction, or nipple discharge Cardiovascular: Regular rate and rhythm Abdomen:  Soft, non tender, no hepatosplenomegaly Pelvic:  External genitalia is normal in appearance, no lesions.  The vagina has decreased color, moisture and rugae. Urethra has no lesions or masses. The cervix is smooth, pap with HPV performed.  Uterus is felt to be normal size, shape, and contour.  No adnexal masses or tenderness noted.Bladder is non tender, no masses felt. Rectal: Good sphincter tone, no polyps, or hemorrhoids felt.  Hemoccult negative. Extremities/musculoskeletal:  No swelling or varicosities noted, no clubbing or cyanosis Psych:  No mood  changes, alert and cooperative,seems happy   Impression:  Well woman gyn exam with pap PMB Trouble sleeping Vaginal dryness   Plan: Will get gyn [place] to assess uterus Try melatonin 5-10 mg at HS Try luvena and astro glide Physical in 1 year Mammogram yearly Colonoscopy next year  Try whole 30 again

## 2014-11-04 LAB — CYTOLOGY - PAP

## 2014-11-14 ENCOUNTER — Ambulatory Visit (INDEPENDENT_AMBULATORY_CARE_PROVIDER_SITE_OTHER): Payer: BC Managed Care – PPO

## 2014-11-14 DIAGNOSIS — N950 Postmenopausal bleeding: Secondary | ICD-10-CM

## 2014-11-14 NOTE — Progress Notes (Signed)
PELVIC US TA/TV: heterogenous anteverted uterus w/ a 1.3 x .8 x .9cm intramural fibroid lt LUS,limited view of ov's bilat,but appear to be normal (mobile),EEC 5.22mm, pain on lt during ultrasound.

## 2014-11-16 ENCOUNTER — Telehealth: Payer: Self-pay | Admitting: Adult Health

## 2014-11-16 NOTE — Telephone Encounter (Signed)
No answer on home phone and no voice mail on cell

## 2014-11-16 NOTE — Telephone Encounter (Signed)
Pt aware of [place] results and need for endo biopsy per [facility], will make appt for 8/15 at 4 pm for endo biopsy with Dr Emelda Fear

## 2014-11-17 ENCOUNTER — Telehealth: Payer: Self-pay | Admitting: Adult Health

## 2014-11-17 NOTE — Telephone Encounter (Signed)
Pt had question about endo bx and her [place] and I answered then for her

## 2014-11-21 ENCOUNTER — Other Ambulatory Visit: Payer: Self-pay | Admitting: Obstetrics and Gynecology

## 2014-11-21 ENCOUNTER — Encounter: Payer: Self-pay | Admitting: Obstetrics and Gynecology

## 2014-11-21 ENCOUNTER — Ambulatory Visit (INDEPENDENT_AMBULATORY_CARE_PROVIDER_SITE_OTHER): Payer: BC Managed Care – PPO | Admitting: Obstetrics and Gynecology

## 2014-11-21 VITALS — BP 120/60 | HR 89 | Ht 64.0 in | Wt 184.5 lb

## 2014-11-21 DIAGNOSIS — R938 Abnormal findings on diagnostic imaging of other specified body structures: Secondary | ICD-10-CM | POA: Diagnosis not present

## 2014-11-21 DIAGNOSIS — R9389 Abnormal findings on diagnostic imaging of other specified body structures: Secondary | ICD-10-CM

## 2014-11-21 DIAGNOSIS — N858 Other specified noninflammatory disorders of uterus: Secondary | ICD-10-CM | POA: Diagnosis not present

## 2014-11-21 DIAGNOSIS — N950 Postmenopausal bleeding: Secondary | ICD-10-CM | POA: Diagnosis not present

## 2014-11-21 NOTE — Progress Notes (Signed)
Patient ID: Courtney Marshall, female   DOB: 1956/02/19, 59 y.o.   MRN: [id number]  Endometrial Biopsy: Patient given informed consent, signed copy in the chart, time out was performed. Time out taken. The patient was placed in the lithotomy position and the cervix brought into view with sterile speculum.  Portio of cervix cleansed x 2 with betadine swabs.  A tenaculum was placed in the anterior lip of the cervix. The uterus was sounded for depth of 8 cm,. Milex uterine Explora 3 mm was introduced to into the uterus, suction created,  and an endometrial sample was obtained. All equipment was removed and accounted for.   The patient tolerated the procedure well.    Patient given post procedure instructions.  Followup: Results in 1 week by phone    This chart was SCRIBED for Christin Bach, MD by Ronney Lion, ED Scribe. This patient was seen in room 2, and the patient's care was started at 4:48 PM.  I personally performed the services described in this documentation, which was SCRIBED in my presence. The recorded information has been reviewed and considered accurate. It has been edited as necessary during review. Tilda Burrow, MD

## 2014-11-25 ENCOUNTER — Telehealth: Payer: Self-pay | Admitting: Adult Health

## 2014-11-25 NOTE — Telephone Encounter (Signed)
Pt aware endo bx benign

## 2015-03-29 ENCOUNTER — Ambulatory Visit: Payer: BC Managed Care – PPO | Admitting: Podiatry

## 2015-04-07 ENCOUNTER — Ambulatory Visit (INDEPENDENT_AMBULATORY_CARE_PROVIDER_SITE_OTHER): Payer: BC Managed Care – PPO | Admitting: Podiatry

## 2015-04-07 ENCOUNTER — Encounter: Payer: Self-pay | Admitting: Podiatry

## 2015-04-07 ENCOUNTER — Ambulatory Visit (INDEPENDENT_AMBULATORY_CARE_PROVIDER_SITE_OTHER): Payer: BC Managed Care – PPO

## 2015-04-07 VITALS — BP 131/76 | HR 76 | Resp 12

## 2015-04-07 DIAGNOSIS — M79672 Pain in left foot: Secondary | ICD-10-CM | POA: Diagnosis not present

## 2015-04-07 DIAGNOSIS — B351 Tinea unguium: Secondary | ICD-10-CM | POA: Diagnosis not present

## 2015-04-07 DIAGNOSIS — M79671 Pain in right foot: Secondary | ICD-10-CM

## 2015-04-07 DIAGNOSIS — L84 Corns and callosities: Secondary | ICD-10-CM

## 2015-04-07 DIAGNOSIS — Q786 Multiple congenital exostoses: Secondary | ICD-10-CM

## 2015-04-07 DIAGNOSIS — M898X0 Other specified disorders of bone, multiple sites: Secondary | ICD-10-CM

## 2015-04-07 MED ORDER — TERBINAFINE HCL 250 MG PO TABS: 250.0000 mg | ORAL_TABLET | Freq: Every day | ORAL | Status: DC

## 2015-04-07 NOTE — Progress Notes (Signed)
Subjective:     Patient ID: Courtney Marshall, female   DOB: 06/02/1955, 59 y.o.   MRN: [id number]  HPI patient states I have lesions on the outside of my fifth toe left over right foot that are painful when pressed with rotation of my toes which can be bothersome area also noted to have discoloration of the nails 1 through 5 bilateral with thickness and pain at times with the nails   Review of Systems  All other systems reviewed and are negative.      Objective:   Physical Exam  Constitutional: She is oriented to person, place, and time.  Cardiovascular: Intact distal pulses.   Musculoskeletal: Normal range of motion.  Neurological: She is oriented to person, place, and time.  Skin: Skin is warm.  Nursing note and vitals reviewed.  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 X-rays reviewed of the feet and we discussed distal arthroplasties and removal of spurring but today we did debridement. Patient states that she gets liver function studies every 3 months that they've been running normally and today were to start her on Lamisil 250 mg daily for 90 days with instructions to get liver function study done within the next month to 6 weeks

## 2015-04-07 NOTE — Progress Notes (Signed)
   Subjective:    Patient ID: Courtney Marshall, female    DOB: 1955/07/20, 59 y.o.   MRN: [id number]  HPI ''B/L GREAT TOENAIL HAVE DISCOLORATION AND 5TH TOE HAVE HARD SKIN.''   Review of Systems  Skin: Positive for color change.       Objective:   Physical Exam        Assessment & Plan:

## 2015-06-14 ENCOUNTER — Telehealth: Payer: Self-pay | Admitting: Internal Medicine

## 2015-06-14 NOTE — Telephone Encounter (Signed)
Pt called to schedule her colonoscopy. She said it's been 10 years. Please call [phone]

## 2015-06-15 ENCOUNTER — Telehealth: Payer: Self-pay

## 2015-06-15 NOTE — Telephone Encounter (Signed)
See note of 06/14/2015.

## 2015-06-15 NOTE — Telephone Encounter (Signed)
I called pt and she is not having any problems and no family hx of colon cancer. She thought her last one was 10 years ago. However, it was 12/07/2007 and she is on recall for 12/07/2017.  She said she just forgot and she is not having any problems. She is aware that she is on recall. She will call if she has problems before then.

## 2015-06-16 NOTE — Telephone Encounter (Signed)
Noted and agree. 

## 2015-07-28 ENCOUNTER — Ambulatory Visit: Payer: BC Managed Care – PPO | Admitting: Podiatry

## 2016-04-18 IMAGING — CT CT HEAD W/O CM
1 series · 16 of 30 positions shown, 20 images · non-contrast
Comparison: None.

CLINICAL DATA: Sudden onset of dizziness, vertigo, headache

EXAM:
CT HEAD WITHOUT CONTRAST
TECHNIQUE: Contiguous axial images were obtained from the base of the skull
through the vertex without intravenous contrast.

[Series 2: headseq 4.8 h37s · axial · 0.43mm/px · z∈[+103,+260]mm · 16 of 36 slices shown, 20 images]
[im 2/36  brain]
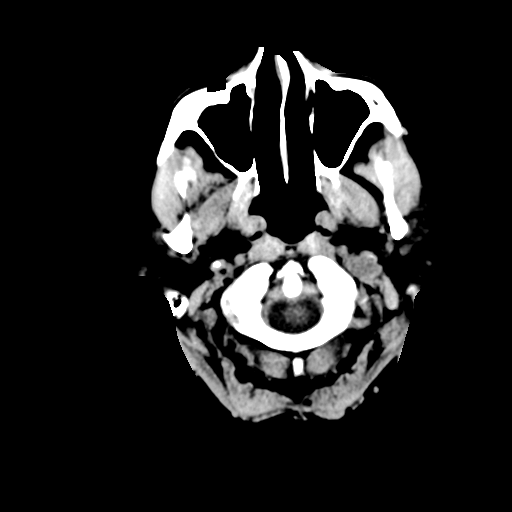
[im 2/36  bone]
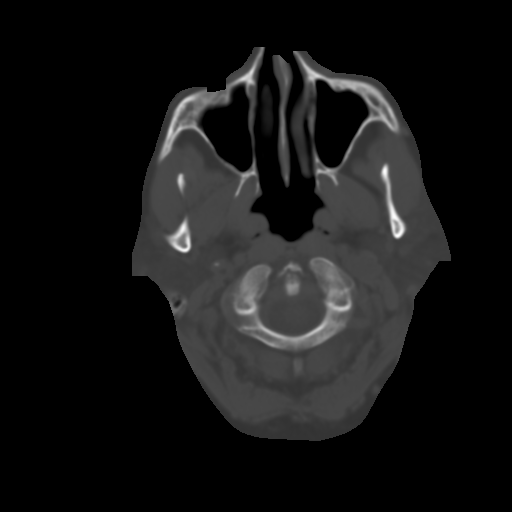
[im 4/36  brain]
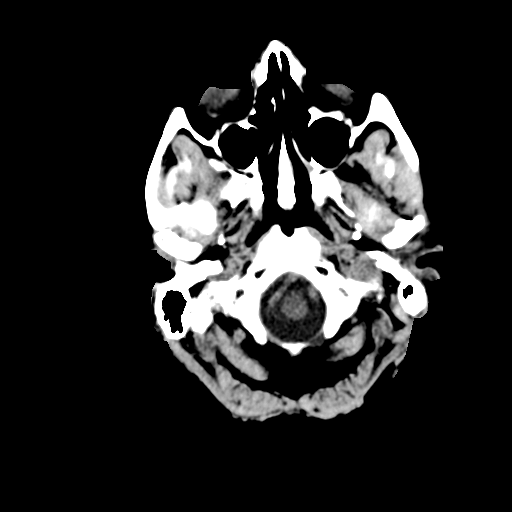
[im 7/36  brain]
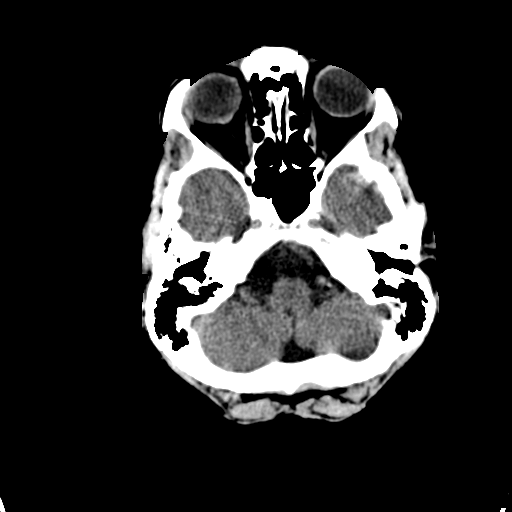
[im 9/36  brain]
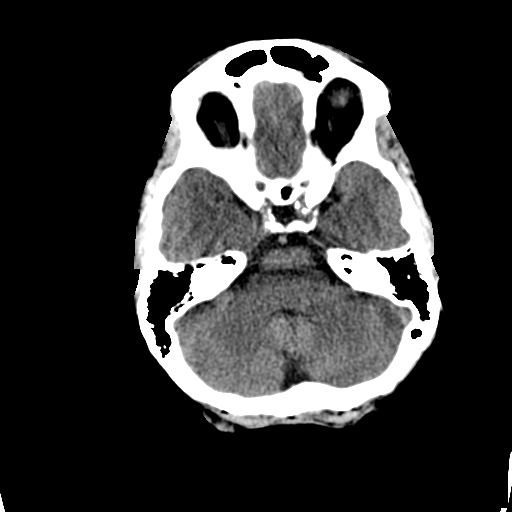
[im 10/36  brain]
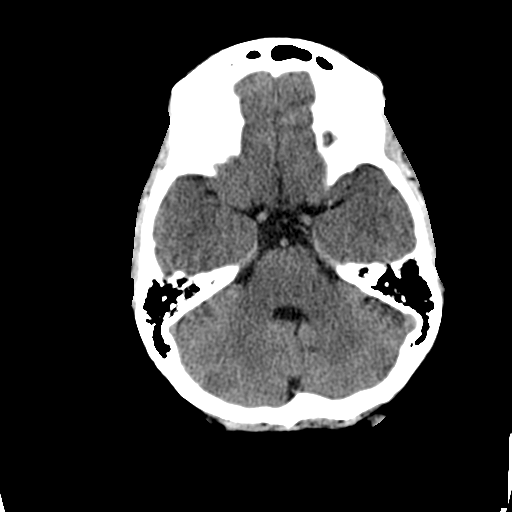
[im 10/36  bone]
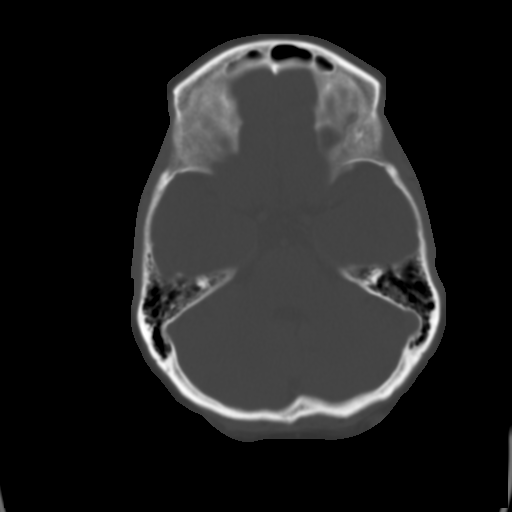
[im 13/36  brain]
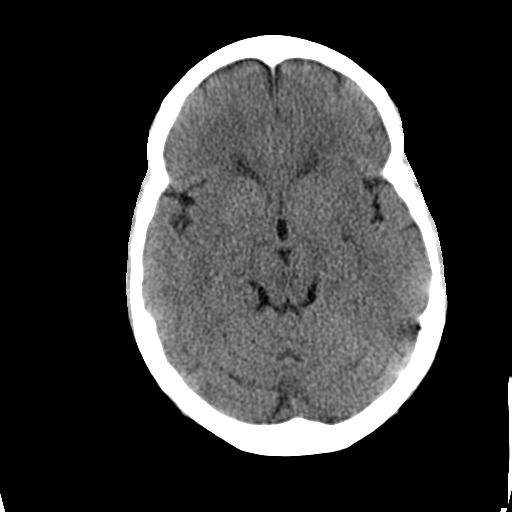
[im 15/36  brain]
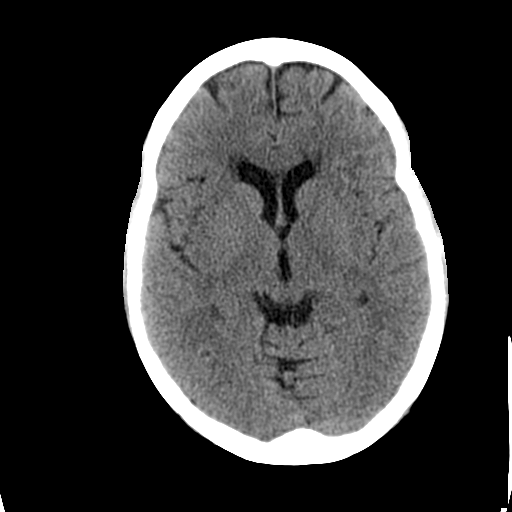
[im 17/36  brain]
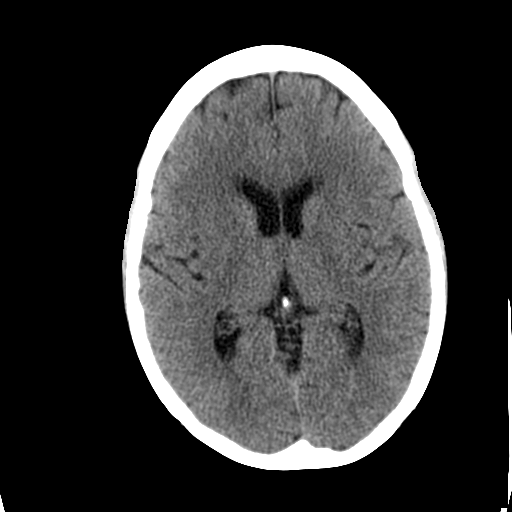
[im 19/36  brain]
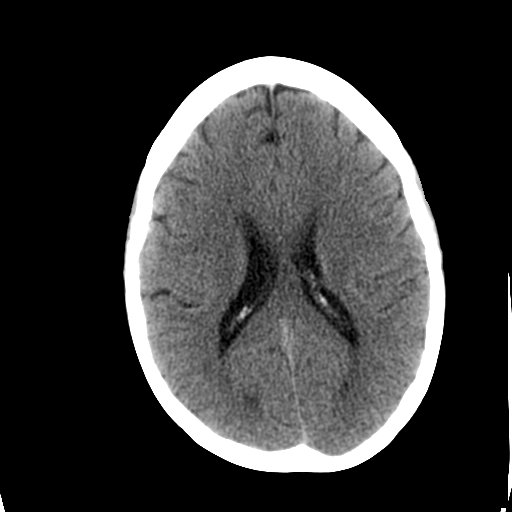
[im 19/36  bone]
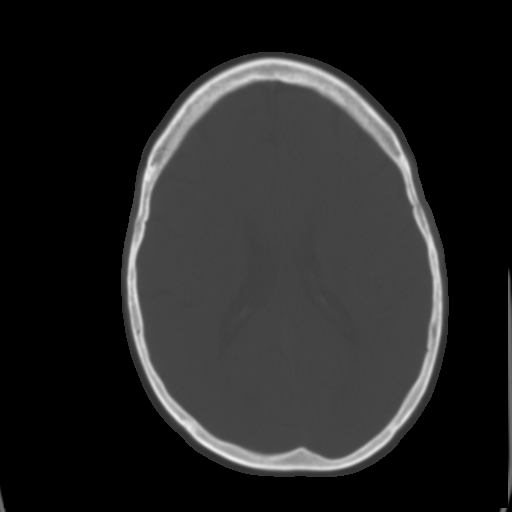
[im 21/36  brain]
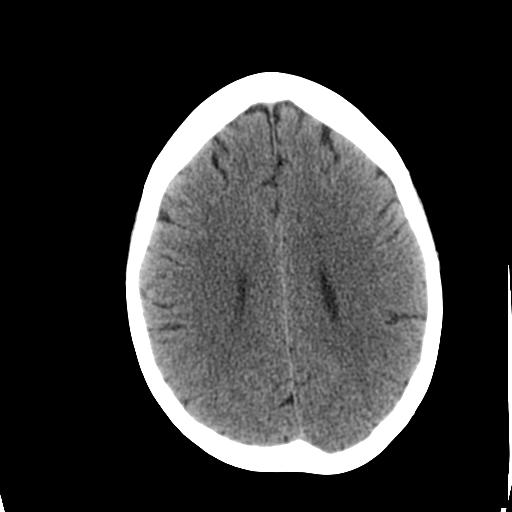
[im 23/36  brain]
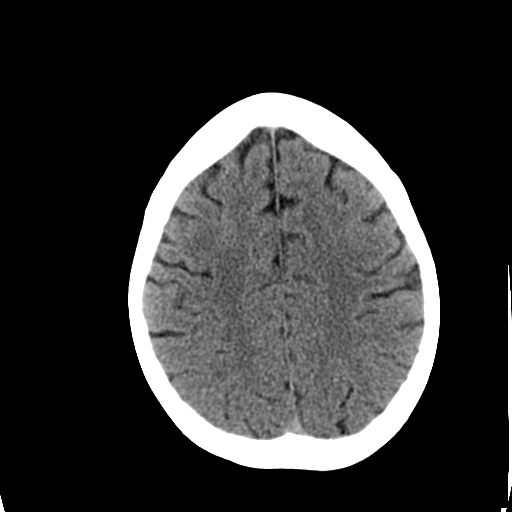
[im 26/36  brain]
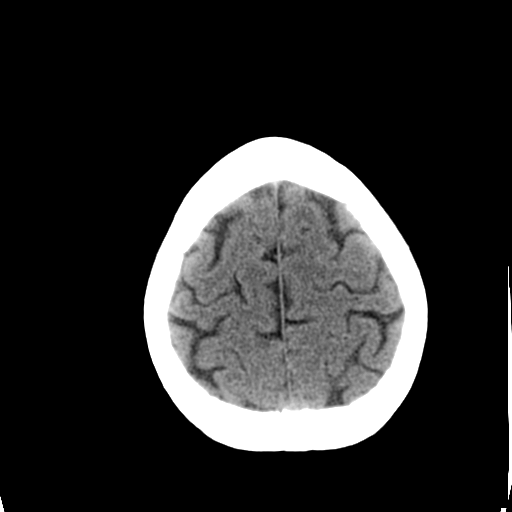
[im 27/36  brain]
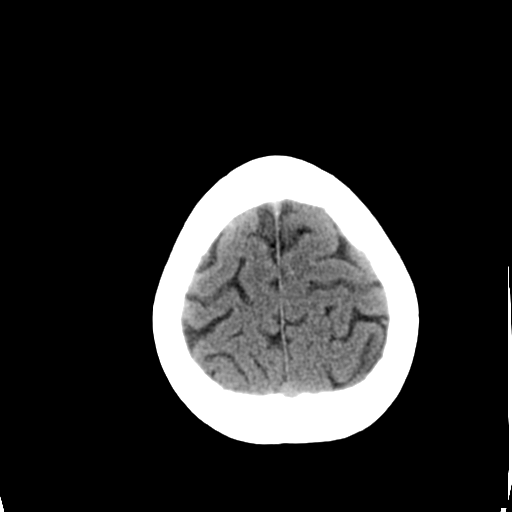
[im 27/36  bone]
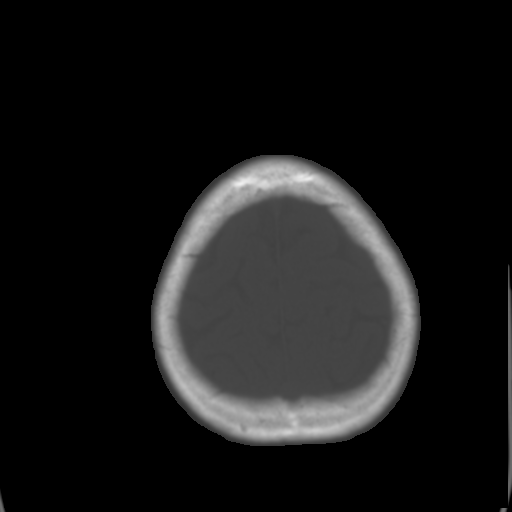
[im 29/36  brain]
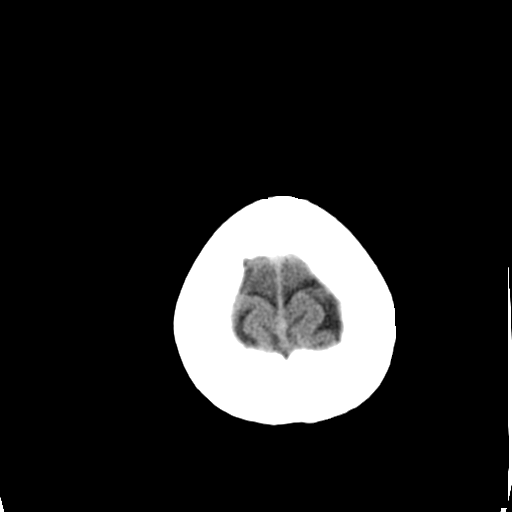
[im 32/36  brain]
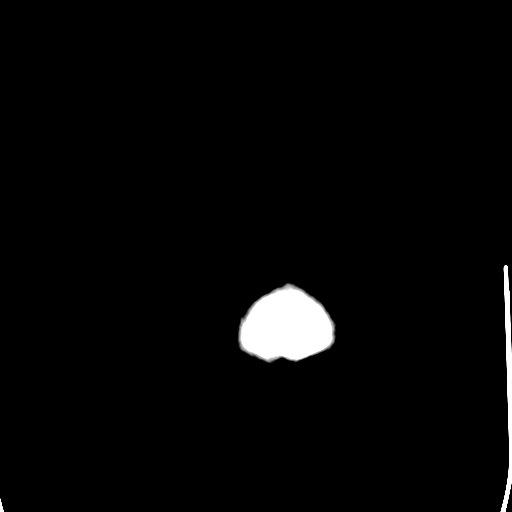
[im 34/36  brain]
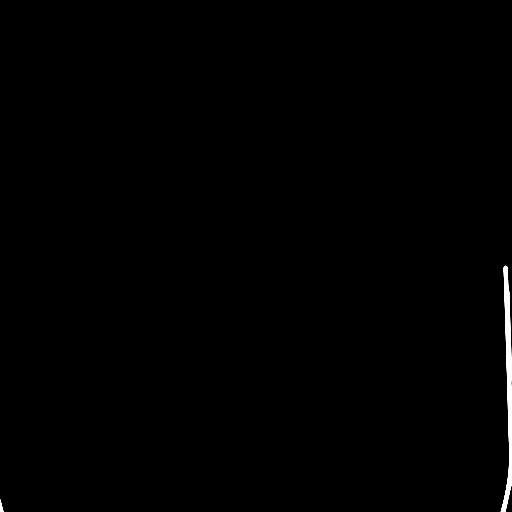

[16 of 30 positions shown; findings below may reference images not displayed]

FINDINGS: No skull fracture is noted. Paranasal sinuses and mastoid air cells
are unremarkable.

No intracranial hemorrhage, mass effect or midline shift.

No acute cortical infarction. No hydrocephalus. The gray and
white-matter differentiation is preserved. No mass lesion is noted
on this unenhanced scan.
IMPRESSION: No acute intracranial abnormality.

## 2016-08-13 ENCOUNTER — Ambulatory Visit: Payer: BC Managed Care – PPO | Admitting: Family Medicine

## 2016-09-19 ENCOUNTER — Encounter: Payer: Self-pay | Admitting: Adult Health

## 2016-09-30 ENCOUNTER — Other Ambulatory Visit: Payer: BC Managed Care – PPO | Admitting: Adult Health

## 2016-10-15 ENCOUNTER — Encounter: Payer: Self-pay | Admitting: Adult Health

## 2016-10-15 ENCOUNTER — Ambulatory Visit (INDEPENDENT_AMBULATORY_CARE_PROVIDER_SITE_OTHER): Payer: BC Managed Care – PPO | Admitting: Adult Health

## 2016-10-15 ENCOUNTER — Other Ambulatory Visit (HOSPITAL_COMMUNITY)
Admission: RE | Admit: 2016-10-15 | Discharge: 2016-10-15 | Disposition: A | Discharge status: Home / Self Care | Payer: BC Managed Care – PPO | Source: Ambulatory Visit | Attending: Adult Health | Admitting: Adult Health

## 2016-10-15 VITALS — BP 122/74 | HR 73 | Ht 64.5 in | Wt 178.0 lb

## 2016-10-15 DIAGNOSIS — Z8262 Family history of osteoporosis: Secondary | ICD-10-CM

## 2016-10-15 DIAGNOSIS — Z01419 Encounter for gynecological examination (general) (routine) without abnormal findings: Secondary | ICD-10-CM

## 2016-10-15 DIAGNOSIS — Z1212 Encounter for screening for malignant neoplasm of rectum: Secondary | ICD-10-CM

## 2016-10-15 DIAGNOSIS — Z780 Asymptomatic menopausal state: Secondary | ICD-10-CM | POA: Insufficient documentation

## 2016-10-15 DIAGNOSIS — Z1211 Encounter for screening for malignant neoplasm of colon: Secondary | ICD-10-CM | POA: Diagnosis not present

## 2016-10-15 LAB — HEMOCCULT GUIAC POC 1CARD (OFFICE): Fecal Occult Blood, POC: NEGATIVE

## 2016-10-15 NOTE — Progress Notes (Signed)
Patient ID: Courtney HaggardCathy S Marshall, female   DOB: 12/20/1955, 61 y.o.   MRN: [id number] History of Present Illness:  Courtney Marshall is a 61 year old white female, married in for well woman gyn exam and pap.She is teaching at [employer].  PCP is Dr Phillips OdorGolding.   Current Medications, Allergies, Past Medical History, Past Surgical History, Family History and Social History were [name].     Review of Systems: Patient denies any headaches, hearing loss, fatigue, blurred vision, shortness of breath, chest pain, abdominal pain, problems with bowel movements, urination, or intercourse. No joint pain or mood swings.Has recently lost 20 lbs doing Whole 30, her A1c was elevated.  She says she would like to lose 10 more lbs.    Physical Exam:BP 122/74 (BP Location: Right Arm, Patient Position: Sitting, Cuff Size: Normal)   Pulse 73   Ht 5' 4.5" (1.638 m)   Wt 178 lb (80.7 kg)   BMI 30.08 kg/m  General:  Well developed, well nourished, no acute distress Skin:  Warm and dry Neck:  Midline trachea, normal thyroid, good ROM, no lymphadenopathy,no carotid bruits heard  Lungs; Clear to auscultation bilaterally Breast:  No dominant palpable mass, retraction, or nipple discharge Cardiovascular: Regular rate and rhythm Abdomen:  Soft, non tender, no hepatosplenomegaly Pelvic:  External genitalia is normal in appearance, no lesions.  The vagina is normal in appearance. Urethra has no lesions or masses. The cervix is smooth,Pap with HPV performed.  Uterus is felt to be normal size, shape, and contour.  No adnexal masses or tenderness noted.Bladder is non tender, no masses felt. Rectal: Good sphincter tone, no polyps, or hemorrhoids felt.  Hemoccult negative. Extremities/musculoskeletal:  No swelling or varicosities noted, no clubbing or cyanosis Psych:  No mood changes, alert and cooperative,seems happy PHQ 2 score 0.Will get DEXA now, mom and sister has osteoporosis.Encourgaed to  exercise.  Impression: 1. Encounter for gynecological examination with Papanicolaou smear of cervix   2. Postmenopausal   3. Family history of osteoporosis   4. Screening for colorectal cancer       [facility]: [facility] Pap in 3 if normal Mammogram yearly Labs with PCP

## 2016-10-17 LAB — CYTOLOGY - PAP
Diagnosis: NEGATIVE
HPV (WINDOPATH): NOT DETECTED

## 2016-11-06 ENCOUNTER — Other Ambulatory Visit (HOSPITAL_COMMUNITY): Payer: BC Managed Care – PPO

## 2017-05-23 ENCOUNTER — Encounter: Payer: Self-pay | Admitting: Internal Medicine

## 2017-07-14 ENCOUNTER — Ambulatory Visit: Payer: BC Managed Care – PPO | Admitting: Gastroenterology

## 2017-11-06 ENCOUNTER — Encounter: Payer: Self-pay | Admitting: General Surgery

## 2017-11-06 ENCOUNTER — Ambulatory Visit: Payer: BC Managed Care – PPO | Admitting: General Surgery

## 2017-11-06 ENCOUNTER — Encounter (INDEPENDENT_AMBULATORY_CARE_PROVIDER_SITE_OTHER): Payer: Self-pay

## 2017-11-06 VITALS — BP 135/94 | HR 92 | Temp 97.8°F | Resp 18 | Wt 183.0 lb

## 2017-11-06 DIAGNOSIS — Z1211 Encounter for screening for malignant neoplasm of colon: Secondary | ICD-10-CM | POA: Diagnosis not present

## 2017-11-06 MED ORDER — PEG 3350-KCL-NABCB-NACL-NASULF 236 G PO SOLR: 4000.0000 mL | Freq: Once | ORAL | 0 refills | Status: AC

## 2017-11-06 NOTE — H&P (Signed)
Courtney Marshall; [id number]; [birth year]   HPI Patient is a 62 year old white female who was referred to my care by Dr. Phillips OdorGolding for a screening colonoscopy.  Patient last had a colonoscopy over 10 years ago.  Nothing was found at that time.  She recently had an episode of sigmoid diverticulitis approximately 4 weeks ago that was treated with antibiotics.  She intermittently has left lower quadrant abdominal pain, but currently has 2 out of 10 abdominal pain.  She denies any fever, chills, diarrhea, constipation, family history of colon cancer, or blood in her stools.  She denies any recent weight loss. Past Medical History:  Diagnosis Date  . Elevated hemoglobin A1c   . Hyperlipidemia   . Hypertension   . PMB (postmenopausal bleeding) 11/03/2014   Had bleeding last year, [facility], will get [facility]  . Trouble in sleeping 11/03/2014  . Vaginal dryness 11/03/2014    Past Surgical History:  Procedure Laterality Date  . CESAREAN SECTION    . TONSILLECTOMY    . TUBAL LIGATION      Family History  Problem Relation Age of Onset  . Osteoporosis Mother   . Heart disease Father   . Hypertension Father   . Hyperlipidemia Father   . Osteoporosis Sister   . Osteoporosis Maternal Grandmother   . Other Maternal Grandfather        hit by train  . Heart attack Paternal Grandfather     Current Outpatient Medications on File Prior to Visit  Medication Sig Dispense Refill  . losartan-hydrochlorothiazide (HYZAAR) 100-25 MG per tablet      No current facility-administered medications on file prior to visit.     Allergies  Allergen Reactions  . Lisinopril Hives    Social History   Substance and Sexual Activity  Alcohol Use No    Social History   Tobacco Use  Smoking Status Never Smoker  Smokeless Tobacco Never Used    Review of Systems  Constitutional: Negative.   HENT: Positive for sinus pain.   Eyes: Negative.   Respiratory: Negative.   Cardiovascular:  Negative.   Gastrointestinal: Negative.   Genitourinary: Negative.   Musculoskeletal: Negative.   Skin: Negative.   Neurological: Negative.   Endo/Heme/Allergies: Negative.   Psychiatric/Behavioral: Negative.     Objective   Vitals:   11/06/17 0900  BP: (!) 135/94  Pulse: 92  Resp: 18  Temp: 97.8 F (36.6 C)    Physical Exam  Constitutional: She is oriented to person, place, and time. She appears well-developed and well-nourished. No distress.  HENT:  Head: Normocephalic and atraumatic.  Cardiovascular: Normal rate, regular rhythm and normal heart sounds. Exam reveals no gallop and no friction rub.  No murmur heard. Pulmonary/Chest: Effort normal and breath sounds normal. No stridor. No respiratory distress. She has no wheezes. She has no rales.  Abdominal: Soft. Bowel sounds are normal. She exhibits no distension and no mass. There is no tenderness. There is no rebound and no guarding.  Neurological: She is alert and oriented to person, place, and time.  Skin: Skin is warm and dry.  Vitals reviewed. CT scan report from outside facility reviewed.  Dr. Lamar BlinksGolding's notes reviewed.  Assessment  Need for screening colonoscopy, history of diverticulitis Plan   Patient is scheduled for screening colonoscopy on 12/02/2017.  The risks and benefits of the procedure including bleeding and perforation were fully explained to the patient, who gave informed consent.  GoLYTELY preparation has been prescribed.

## 2017-11-06 NOTE — Progress Notes (Signed)
Courtney Marshall; [id number]; 02/01/1956   HPI Patient is a 62-year-old white female who was referred to my care by Dr. Golding for a screening colonoscopy.  Patient last had a colonoscopy over 10 years ago.  Nothing was found at that time.  She recently had an episode of sigmoid diverticulitis approximately 4 weeks ago that was treated with antibiotics.  She intermittently has left lower quadrant abdominal pain, but currently has 2 out of 10 abdominal pain.  She denies any fever, chills, diarrhea, constipation, family history of colon cancer, or blood in her stools.  She denies any recent weight loss. Past Medical History:  Diagnosis Date  . Elevated hemoglobin A1c   . Hyperlipidemia   . Hypertension   . PMB (postmenopausal bleeding) 11/03/2014   Had bleeding last year, after trying HRT by [facility], will get US  . Trouble in sleeping 11/03/2014  . Vaginal dryness 11/03/2014    Past Surgical History:  Procedure Laterality Date  . CESAREAN SECTION    . TONSILLECTOMY    . TUBAL LIGATION      Family History  Problem Relation Age of Onset  . Osteoporosis Mother   . Heart disease Father   . Hypertension Father   . Hyperlipidemia Father   . Osteoporosis Sister   . Osteoporosis Maternal Grandmother   . Other Maternal Grandfather        hit by train  . Heart attack Paternal Grandfather     Current Outpatient Medications on File Prior to Visit  Medication Sig Dispense Refill  . losartan-hydrochlorothiazide (HYZAAR) 100-25 MG per tablet      No current facility-administered medications on file prior to visit.     Allergies  Allergen Reactions  . Lisinopril Hives    Social History   Substance and Sexual Activity  Alcohol Use No    Social History   Tobacco Use  Smoking Status Never Smoker  Smokeless Tobacco Never Used    Review of Systems  Constitutional: Negative.   HENT: Positive for sinus pain.   Eyes: Negative.   Respiratory: Negative.   Cardiovascular:  Negative.   Gastrointestinal: Negative.   Genitourinary: Negative.   Musculoskeletal: Negative.   Skin: Negative.   Neurological: Negative.   Endo/Heme/Allergies: Negative.   Psychiatric/Behavioral: Negative.     Objective   Vitals:   11/06/17 0900  BP: (!) 135/94  Pulse: 92  Resp: 18  Temp: 97.8 F (36.6 C)    Physical Exam  Constitutional: She is oriented to person, place, and time. She appears well-developed and well-nourished. No distress.  HENT:  Head: Normocephalic and atraumatic.  Cardiovascular: Normal rate, regular rhythm and normal heart sounds. Exam reveals no gallop and no friction rub.  No murmur heard. Pulmonary/Chest: Effort normal and breath sounds normal. No stridor. No respiratory distress. She has no wheezes. She has no rales.  Abdominal: Soft. Bowel sounds are normal. She exhibits no distension and no mass. There is no tenderness. There is no rebound and no guarding.  Neurological: She is alert and oriented to person, place, and time.  Skin: Skin is warm and dry.  Vitals reviewed. CT scan report from outside facility reviewed.  Dr. Golding's notes reviewed.  Assessment  Need for screening colonoscopy, history of diverticulitis Plan   Patient is scheduled for screening colonoscopy on 12/02/2017.  The risks and benefits of the procedure including bleeding and perforation were fully explained to the patient, who gave informed consent.  GoLYTELY preparation has been prescribed.  

## 2017-11-06 NOTE — Patient Instructions (Signed)
Colonoscopy, Adult A colonoscopy is an exam to look at the entire large intestine. During the exam, a lubricated, bendable tube is inserted into the anus and then passed into the rectum, colon, and other parts of the large intestine. A colonoscopy is often done as a part of normal colorectal screening or in response to certain symptoms, such as anemia, persistent diarrhea, abdominal pain, and blood in the stool. The exam can help screen for and diagnose medical problems, including:  Tumors.  Polyps.  Inflammation.  Areas of bleeding.  Tell a health care provider about:  Any allergies you have.  All medicines you are taking, including vitamins, herbs, eye drops, creams, and over-the-counter medicines.  Any problems you or family members have had with anesthetic medicines.  Any blood disorders you have.  Any surgeries you have had.  Any medical conditions you have.  Any problems you have had passing stool. What are the risks? Generally, this is a safe procedure. However, problems may occur, including:  Bleeding.  A tear in the intestine.  A reaction to medicines given during the exam.  Infection (rare).  What happens before the procedure? Eating and drinking restrictions Follow instructions from your health care provider about eating and drinking, which may include:  A few days before the procedure - follow a low-fiber diet. Avoid nuts, seeds, dried fruit, raw fruits, and vegetables.  1-3 days before the procedure - follow a clear liquid diet. Drink only clear liquids, such as clear broth or bouillon, black coffee or tea, clear juice, clear soft drinks or sports drinks, gelatin dessert, and popsicles. Avoid any liquids that contain red or purple dye.  On the day of the procedure - do not eat or drink anything during the 2 hours before the procedure, or within the time period that your health care provider recommends.  Bowel prep If you were prescribed an oral bowel prep  to clean out your colon:  Take it as told by your health care provider. Starting the day before your procedure, you will need to drink a large amount of medicated liquid. The liquid will cause you to have multiple loose stools until your stool is almost clear or light green.  If your skin or anus gets irritated from diarrhea, you may use these to relieve the irritation: ? Medicated wipes, such as adult wet wipes with aloe and vitamin E. ? A skin soothing-product like petroleum jelly.  If you vomit while drinking the bowel prep, take a break for up to 60 minutes and then begin the bowel prep again. If vomiting continues and you cannot take the bowel prep without vomiting, call your health care provider.  General instructions  Ask your health care provider about changing or stopping your regular medicines. This is especially important if you are taking diabetes medicines or blood thinners.  Plan to have someone take you home from the hospital or clinic. What happens during the procedure?  An IV tube may be inserted into one of your veins.  You will be given medicine to help you relax (sedative).  To reduce your risk of infection: ? Your health care team will wash or sanitize their hands. ? Your anal area will be washed with soap.  You will be asked to lie on your side with your knees bent.  Your health care provider will lubricate a long, thin, flexible tube. The tube will have a camera and a light on the end.  The tube will be inserted into your   anus.  The tube will be gently eased through your rectum and colon.  Air will be delivered into your colon to keep it open. You may feel some pressure or cramping.  The camera will be used to take images during the procedure.  A small tissue sample may be removed from your body to be examined under a microscope (biopsy). If any potential problems are found, the tissue will be sent to a lab for testing.  If small polyps are found, your  health care provider may remove them and have them checked for cancer cells.  The tube that was inserted into your anus will be slowly removed. The procedure may vary among health care providers and hospitals. What happens after the procedure?  Your blood pressure, heart rate, breathing rate, and blood oxygen level will be monitored until the medicines you were given have worn off.  Do not drive for 24 hours after the exam.  You may have a small amount of blood in your stool.  You may pass gas and have mild abdominal cramping or bloating due to the air that was used to inflate your colon during the exam.  It is up to you to get the results of your procedure. Ask your health care provider, or the department performing the procedure, when your results will be ready. This information is not intended to replace advice given to you by your health care provider. Make sure you discuss any questions you have with your health care provider. Document Released: 03/22/2000 Document Revised: 01/24/2016 Document Reviewed: 06/06/2015 Elsevier Interactive Patient Education  2018 [employer]vier Inc.  

## 2017-12-16 ENCOUNTER — Encounter (HOSPITAL_COMMUNITY): Payer: Self-pay | Admitting: *Deleted

## 2017-12-16 ENCOUNTER — Ambulatory Visit (HOSPITAL_COMMUNITY)
Admission: RE | Admit: 2017-12-16 | Discharge: 2017-12-16 | Disposition: A | Discharge status: Home / Self Care | Payer: BC Managed Care – PPO | Source: Ambulatory Visit | Attending: General Surgery | Admitting: General Surgery

## 2017-12-16 ENCOUNTER — Other Ambulatory Visit: Payer: Self-pay

## 2017-12-16 ENCOUNTER — Encounter (HOSPITAL_COMMUNITY)
Admission: RE | Disposition: A | Discharge status: Home / Self Care | Payer: Self-pay | Source: Ambulatory Visit | Attending: General Surgery

## 2017-12-16 DIAGNOSIS — Z79899 Other long term (current) drug therapy: Secondary | ICD-10-CM | POA: Insufficient documentation

## 2017-12-16 DIAGNOSIS — K5732 Diverticulitis of large intestine without perforation or abscess without bleeding: Secondary | ICD-10-CM | POA: Diagnosis not present

## 2017-12-16 DIAGNOSIS — E785 Hyperlipidemia, unspecified: Secondary | ICD-10-CM | POA: Diagnosis not present

## 2017-12-16 DIAGNOSIS — Z888 Allergy status to other drugs, medicaments and biological substances status: Secondary | ICD-10-CM | POA: Insufficient documentation

## 2017-12-16 DIAGNOSIS — Z1211 Encounter for screening for malignant neoplasm of colon: Secondary | ICD-10-CM | POA: Diagnosis not present

## 2017-12-16 DIAGNOSIS — K5730 Diverticulosis of large intestine without perforation or abscess without bleeding: Secondary | ICD-10-CM | POA: Diagnosis not present

## 2017-12-16 DIAGNOSIS — I10 Essential (primary) hypertension: Secondary | ICD-10-CM | POA: Diagnosis not present

## 2017-12-16 HISTORY — PX: COLONOSCOPY: SHX5424

## 2017-12-16 SURGERY — COLONOSCOPY
Anesthesia: Moderate Sedation

## 2017-12-16 MED ORDER — MIDAZOLAM HCL 5 MG/5ML IJ SOLN
INTRAMUSCULAR | Status: DC | PRN
  Administered 2017-12-16: 2 mg via INTRAVENOUS
  Administered 2017-12-16 (×2): 1 mg via INTRAVENOUS

## 2017-12-16 MED ORDER — MIDAZOLAM HCL 5 MG/5ML IJ SOLN
INTRAMUSCULAR | Status: AC
  Filled 2017-12-16: qty 10

## 2017-12-16 MED ORDER — SODIUM CHLORIDE 0.9 % IV SOLN
INTRAVENOUS | Status: DC
  Administered 2017-12-16: 07:00:00 via INTRAVENOUS

## 2017-12-16 MED ORDER — STERILE WATER FOR IRRIGATION IR SOLN
Status: DC | PRN
  Administered 2017-12-16: 100 mL

## 2017-12-16 MED ORDER — MEPERIDINE HCL 50 MG/ML IJ SOLN
INTRAMUSCULAR | Status: AC
  Filled 2017-12-16: qty 1

## 2017-12-16 MED ORDER — MEPERIDINE HCL 50 MG/ML IJ SOLN
INTRAMUSCULAR | Status: DC | PRN
  Administered 2017-12-16: 50 mg via INTRAVENOUS
  Administered 2017-12-16: 25 mg via INTRAVENOUS

## 2017-12-16 NOTE — Op Note (Signed)
[facility] Patient Name: Courtney Marshall Procedure Date: 12/16/2017 7:10 AM MRN: [id number] Date of Birth: 12-03-1955 Attending MD: Franky Macho , MD CSN: [id number] Age: 62 Admit Type: Outpatient Procedure:                Colonoscopy Indications:              Screening for colorectal malignant neoplasm Providers:                Franky Macho, MD, Edrick Kins, RN Referring MD:              Medicines:                Midazolam 4 mg IV, Meperidine 75 mg IV Complications:            No immediate complications. Estimated blood loss:                            None. Estimated Blood Loss:     Estimated blood loss: none. Procedure:                Pre-Anesthesia Assessment:                           - Prior to the procedure, a History and Physical                            was performed, and patient medications and                            allergies were reviewed. The patient is competent.                            The risks and benefits of the procedure and the                            sedation options and risks were discussed with the                            patient. All questions were answered and informed                            consent was obtained. Patient identification and                            proposed procedure were verified by the physician,                            the nurse and the technician in the endoscopy                            suite. Mental Status Examination: alert and                            oriented. Airway Examination: normal oropharyngeal  airway and neck mobility. Respiratory Examination:                            clear to auscultation. CV Examination: RRR, no                            murmurs, no S3 or S4. Prophylactic Antibiotics: The                            patient does not require prophylactic antibiotics.                            Prior Anticoagulants: The patient has taken no   previous anticoagulant or antiplatelet agents. ASA                            Grade Assessment: II - A patient with mild systemic                            disease. After reviewing the risks and benefits,                            the patient was deemed in satisfactory condition to                            undergo the procedure. The anesthesia plan was to                            use moderate sedation / analgesia (conscious                            sedation). Immediately prior to administration of                            medications, the patient was re-assessed for                            adequacy to receive sedatives. The heart rate,                            respiratory rate, oxygen saturations, blood                            pressure, adequacy of pulmonary ventilation, and                            response to care were monitored throughout the                            procedure. The physical status of the patient was                            re-assessed after the procedure.  After obtaining informed consent, the colonoscope                            was passed under direct vision. Throughout the                            procedure, the patient's blood pressure, pulse, and                            oxygen saturations were monitored continuously. The                            [id number]-[id number] ([id number]) scope was introduced through                            the anus and advanced to the the cecum, identified                            by the appendiceal orifice, ileocecal valve and                            palpation. No anatomical landmarks were                            photographed. The entire colon was well visualized.                            The colonoscopy was somewhat difficult due to                            multiple diverticula in the colon. The quality of                            the bowel preparation was adequate. The total                             duration of the procedure was 15 minutes. Scope In: 7:25:26 AM Scope Out: 7:38:26 AM Scope Withdrawal Time: 0 hours 4 minutes 6 seconds  Total Procedure Duration: 0 hours 13 minutes 0 seconds  Findings:      Multiple medium-mouthed diverticula were found in the sigmoid colon and       descending colon. There was no evidence of diverticular bleeding.      The entire examined colon appeared normal on direct and retroflexion       views. Impression:               - Moderate diverticulosis in the sigmoid colon and                            in the descending colon. There was no evidence of                            diverticular bleeding.                           -  The entire examined colon is normal on direct and                            retroflexion views.                           - No specimens collected. Moderate Sedation:      Moderate (conscious) sedation was administered by the endoscopy nurse       and supervised by the endoscopist. The following parameters were       monitored: oxygen saturation, heart rate, blood pressure, and response       to care. Recommendation:           - Written discharge instructions were provided to                            the patient.                           - The signs and symptoms of potential delayed                            complications were discussed with the patient.                           - Patient has a contact number available for                            emergencies.                           - Return to normal activities tomorrow.                           - Resume previous diet.                           - Continue present medications.                           - Repeat colonoscopy in 10 years for screening                            purposes. Procedure Code(s):        --- Professional ---                           [phone], Colonoscopy, flexible; diagnostic, including                             collection of specimen(s) by brushing or washing,                            when performed (separate procedure) Diagnosis Code(s):        --- Professional ---                           Z12.11,  Encounter for screening for malignant                            neoplasm of colon                           K57.30, Diverticulosis of large intestine without                            perforation or abscess without bleeding CPT copyright 2017 American [employer]. All rights reserved. The codes documented in this report are preliminary and upon coder review may  be revised to meet current compliance requirements. Franky Macho, MD Franky Macho, MD 12/16/2017 7:43:39 AM This report has been signed electronically. Number of Addenda: 0

## 2017-12-16 NOTE — Interval H&P Note (Signed)
History and Physical Interval Note:  12/16/2017 7:20 AM  Courtney Marshall  has presented today for surgery, with the diagnosis of screening  The various methods of treatment have been discussed with the patient and family. After consideration of risks, benefits and other options for treatment, the patient has consented to  Procedure(s): COLONOSCOPY (N/A) as a surgical intervention .  The patient's history has been reviewed, patient examined, no change in status, stable for surgery.  I have reviewed the patient's chart and labs.  Questions were answered to the patient's satisfaction.     Franky Macho

## 2017-12-16 NOTE — Discharge Instructions (Signed)
Colonoscopy, Adult, Care After  This sheet gives you information about how to care for yourself after your procedure. Your health care provider may also give you more specific instructions. If you have problems or questions, contact your health care provider.  What can I expect after the procedure?  After the procedure, it is common to have:  · A small amount of blood in your stool for 24 hours after the procedure.  · Some gas.  · Mild abdominal cramping or bloating.    Follow these instructions at home:  General instructions    · For the first 24 hours after the procedure:  ? Do not drive or use machinery.  ? Do not sign important documents.  ? Do not drink alcohol.  ? Do your regular daily activities at a slower pace than normal.  ? Eat soft, easy-to-digest foods.  ? Rest often.  · Take over-the-counter or prescription medicines only as told by your health care provider.  · It is up to you to get the results of your procedure. Ask your health care provider, or the department performing the procedure, when your results will be ready.  Relieving cramping and bloating  · Try walking around when you have cramps or feel bloated.  · Apply heat to your abdomen as told by your health care provider. Use a heat source that your health care provider recommends, such as a moist heat pack or a heating pad.  ? Place a towel between your skin and the heat source.  ? Leave the heat on for 20-30 minutes.  ? Remove the heat if your skin turns bright red. This is especially important if you are unable to feel pain, heat, or cold. You may have a greater risk of getting burned.  Eating and drinking  · Drink enough fluid to keep your urine clear or pale yellow.  · Resume your normal diet as instructed by your health care provider. Avoid heavy or fried foods that are hard to digest.  · Avoid drinking alcohol for as long as instructed by your health care provider.  Contact a health care provider if:  · You have blood in your stool 2-3  days after the procedure.  Get help right away if:  · You have more than a small spotting of blood in your stool.  · You pass large blood clots in your stool.  · Your abdomen is swollen.  · You have nausea or vomiting.  · You have a fever.  · You have increasing abdominal pain that is not relieved with medicine.  This information is not intended to replace advice given to you by your health care provider. Make sure you discuss any questions you have with your health care provider.  Document Released: 11/07/2003 Document Revised: 12/18/2015 Document Reviewed: 06/06/2015  Elsevier Interactive Patient Education © 2018 Elsevier Inc.

## 2017-12-22 ENCOUNTER — Encounter (HOSPITAL_COMMUNITY): Payer: Self-pay | Admitting: General Surgery

## 2017-12-30 ENCOUNTER — Ambulatory Visit: Payer: BC Managed Care – PPO | Admitting: Gastroenterology

## 2018-02-05 ENCOUNTER — Other Ambulatory Visit (HOSPITAL_COMMUNITY): Payer: Self-pay | Admitting: General Surgery

## 2018-02-10 ENCOUNTER — Other Ambulatory Visit (HOSPITAL_COMMUNITY): Payer: Self-pay | Admitting: Preventative Medicine

## 2018-02-10 DIAGNOSIS — M545 Low back pain, unspecified: Secondary | ICD-10-CM

## 2018-02-10 DIAGNOSIS — S300XXA Contusion of lower back and pelvis, initial encounter: Secondary | ICD-10-CM

## 2018-02-17 ENCOUNTER — Other Ambulatory Visit (HOSPITAL_COMMUNITY): Payer: Self-pay | Admitting: Preventative Medicine

## 2018-02-17 DIAGNOSIS — S34101A Unspecified injury to L1 level of lumbar spinal cord, initial encounter: Secondary | ICD-10-CM

## 2018-02-18 ENCOUNTER — Encounter (HOSPITAL_COMMUNITY): Payer: Self-pay

## 2018-02-18 ENCOUNTER — Encounter (HOSPITAL_COMMUNITY)
Admission: RE | Admit: 2018-02-18 | Discharge: 2018-02-18 | Disposition: A | Discharge status: Home / Self Care | Payer: Worker's Compensation | Source: Ambulatory Visit | Attending: Preventative Medicine | Admitting: Preventative Medicine

## 2018-02-18 ENCOUNTER — Encounter (HOSPITAL_COMMUNITY): Payer: BC Managed Care – PPO

## 2018-02-18 ENCOUNTER — Other Ambulatory Visit (HOSPITAL_COMMUNITY): Payer: BC Managed Care – PPO

## 2018-02-18 DIAGNOSIS — S34101A Unspecified injury to L1 level of lumbar spinal cord, initial encounter: Secondary | ICD-10-CM | POA: Insufficient documentation

## 2018-02-18 MED ORDER — TECHNETIUM TC 99M MEDRONATE IV KIT
20.0000 | PACK | Freq: Once | INTRAVENOUS | Status: AC | PRN
  Administered 2018-02-18: 20 via INTRAVENOUS

## 2018-04-22 ENCOUNTER — Encounter: Payer: Self-pay | Admitting: Adult Health

## 2018-04-22 ENCOUNTER — Ambulatory Visit (INDEPENDENT_AMBULATORY_CARE_PROVIDER_SITE_OTHER): Payer: BC Managed Care – PPO | Admitting: Adult Health

## 2018-04-22 ENCOUNTER — Other Ambulatory Visit (HOSPITAL_COMMUNITY)
Admission: RE | Admit: 2018-04-22 | Discharge: 2018-04-22 | Disposition: A | Discharge status: Home / Self Care | Payer: BC Managed Care – PPO | Source: Ambulatory Visit | Attending: Adult Health | Admitting: Adult Health

## 2018-04-22 VITALS — BP 133/80 | HR 78 | Ht 63.25 in | Wt 182.0 lb

## 2018-04-22 DIAGNOSIS — Z780 Asymptomatic menopausal state: Secondary | ICD-10-CM

## 2018-04-22 DIAGNOSIS — Z01419 Encounter for gynecological examination (general) (routine) without abnormal findings: Secondary | ICD-10-CM | POA: Insufficient documentation

## 2018-04-22 DIAGNOSIS — Z1211 Encounter for screening for malignant neoplasm of colon: Secondary | ICD-10-CM | POA: Insufficient documentation

## 2018-04-22 DIAGNOSIS — Z1212 Encounter for screening for malignant neoplasm of rectum: Secondary | ICD-10-CM

## 2018-04-22 LAB — HEMOCCULT GUIAC POC 1CARD (OFFICE): Fecal Occult Blood, POC: NEGATIVE

## 2018-04-22 NOTE — Progress Notes (Signed)
Patient ID: Courtney HaggardCathy S Marshall, female   DOB: Jun 30, 1955, 63 y.o.   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  PCP is Dr Phillips OdorGolding.    Current Medications, Allergies, Past Medical History, Past Surgical History, Family History and Social History were reviewed in [name] electronic medical record.     Review of Systems: Patient denies any headaches, hearing loss, fatigue, blurred vision, shortness of breath, chest pain, abdominal pain, problems with bowel movements, urination, or intercourse. No joint pain or mood swings.    Physical Exam:BP 133/80 (BP Location: Left Arm, Patient Position: Sitting, Cuff Size: Normal)   Pulse 78   Ht 5' 3.25" (1.607 m)   Wt 182 lb (82.6 kg)   BMI 31.99 kg/m  General:  Well developed, well nourished, no acute distress Skin:  Warm and dry Neck:  Midline trachea, normal thyroid, good ROM, no lymphadenopathy,no carotid bruits heard Lungs; Clear to auscultation bilaterally Breast:  No dominant palpable mass, retraction, or nipple discharge Cardiovascular: Regular rate and rhythm Abdomen:  Soft, non tender, no hepatosplenomegaly Pelvic:  External genitalia is normal in appearance, no lesions.  The vagina is normal in appearance for age with loss of color, moisture and rugae. Urethra has no lesions or masses. The cervix is smooth, pap with HPV performed.  Uterus is felt to be normal size, shape, and contour.  No adnexal masses or tenderness noted.Bladder is non tender, no masses felt. Rectal: Good sphincter tone, no polyps, or hemorrhoids felt.  Hemoccult negative. Extremities/musculoskeletal:  No swelling or varicosities noted, no clubbing or cyanosis Psych:  No mood changes, alert and cooperative,seems happy PHQ 2  score 0. Fall risk is moderate. Examination chaperoned by Malachy MoodJanet Young LPN.  Impression:  1. Encounter for gynecological examination with Papanicolaou smear of cervix   2. Postmenopausal   3. Screening for colorectal cancer      Plan:  Physical in 1 year Pap in 3 if normal Labs with PCP Get the flu shot Colonoscopy per GI

## 2018-04-23 LAB — CYTOLOGY - PAP
Diagnosis: NEGATIVE
HPV (WINDOPATH): NOT DETECTED

## 2019-06-14 ENCOUNTER — Encounter: Payer: Self-pay | Admitting: Internal Medicine

## 2019-06-28 ENCOUNTER — Encounter: Payer: Self-pay | Admitting: Nurse Practitioner

## 2019-06-28 ENCOUNTER — Other Ambulatory Visit: Payer: Self-pay

## 2019-06-28 ENCOUNTER — Ambulatory Visit: Payer: BC Managed Care – PPO | Admitting: Nurse Practitioner

## 2019-06-28 ENCOUNTER — Encounter: Payer: Self-pay | Admitting: *Deleted

## 2019-06-28 ENCOUNTER — Telehealth: Payer: Self-pay | Admitting: *Deleted

## 2019-06-28 DIAGNOSIS — R1319 Other dysphagia: Secondary | ICD-10-CM

## 2019-06-28 DIAGNOSIS — R1310 Dysphagia, unspecified: Secondary | ICD-10-CM

## 2019-06-28 DIAGNOSIS — K219 Gastro-esophageal reflux disease without esophagitis: Secondary | ICD-10-CM | POA: Insufficient documentation

## 2019-06-28 MED ORDER — PANTOPRAZOLE SODIUM 40 MG PO TBEC: 40.0000 mg | DELAYED_RELEASE_TABLET | Freq: Two times a day (BID) | ORAL | 3 refills | Status: DC

## 2019-06-28 NOTE — Patient Instructions (Addendum)
Your health issues we discussed today were:   GERD (reflux/heartburn): 1. I have sent a new prescription to your pharmacy to increase your Protonix to 40 mg twice daily. 2. You can start taking the current medication you have on hand twice a day and when you run out call the pharmacy and ask for them to fill the new prescription 3. Information is below about trigger foods to avoid if they cause worsening reflux for you 4. Call us if you have any worsening or severe symptoms  Dysphagia: 1. I am glad you found some improvement with Protonix.  I am increasing your Protonix frequency as per above 2. We will schedule an upper endoscopy with possible dilation to help further evaluate your symptoms and treat your swallowing difficulties 3. Printed information is below about swallowing precautions.  Namely, take your time eating, chew adequately, keep plenty of fluids on hand while eating.  Try to stick with more soft/tender foods for now 4. If you have food gets stuck on the way down and it will not go forward or will not come back and lasts longer than a couple hours, proceed to the emergency room for evaluation 5. Call us if you have any worsening or severe symptoms  Overall I recommend:  1. Continue your other current medications 2. Return for follow-up in 4 months 3. Call us if you have any questions or concerns   ---------------------------------------------------------------  I am glad you got your first Covid vaccine!  Keep your appointment for your second vaccine.  Even though you will be vaccinated, continue to wear a mask and socially distance from those you do not live with.  ---------------------------------------------------------------   At [facility] we value your feedback. You may receive a survey about your visit today. Please share your experience as we strive to create trusting relationships with our patients to provide genuine, compassionate, quality care.   We appreciate your understanding and patience as we review any laboratory studies, imaging, and other diagnostic tests that are ordered as we care for you. Our office policy is 5 business days for review of these results, and any emergent or urgent results are addressed in a timely manner for your best interest. If you do not hear from our office in 1 week, please contact us.   We also encourage the use of MyChart, which contains your medical information for your review as well. If you are not enrolled in this feature, an access code is on this after visit summary for your convenience. Thank you for allowing [place] to be involved in your care.  It was great to see you today!  I hope you have a great day!!     Food Choices for Gastroesophageal Reflux Disease, Adult When you have gastroesophageal reflux disease (GERD), the foods you eat and your eating habits are very important. Choosing the right foods can help ease the discomfort of GERD. Consider working with a diet and nutrition specialist (dietitian) to help you make healthy food choices. What general guidelines should I follow?  Eating plan  Choose healthy foods low in fat, such as fruits, vegetables, whole grains, low-fat dairy products, and lean meat, fish, and poultry.  Eat frequent, small meals instead of three large meals each day. Eat your meals slowly, in a relaxed setting. Avoid bending over or lying down until 2-3 hours after eating.  Limit high-fat foods such as fatty meats or fried foods.  Limit your intake of oils, butter, and shortening to less than  8 teaspoons each day.  Avoid the following: ? Foods that cause symptoms. These may be different for different people. Keep a food diary to keep track of foods that cause symptoms. ? Alcohol. ? Drinking large amounts of liquid with meals. ? Eating meals during the 2-3 hours before bed.  Cook foods using methods other than frying. This may include baking, grilling, or broiling.  Lifestyle  Maintain a healthy weight. Ask your health care provider what weight is healthy for you. If you need to lose weight, work with your health care provider to do so safely.  Exercise for at least 30 minutes on 5 or more days each week, or as told by your health care provider.  Avoid wearing clothes that fit tightly around your waist and chest.  Do not use any products that contain nicotine or tobacco, such as cigarettes and e-cigarettes. If you need help quitting, ask your health care provider.  Sleep with the head of your bed raised. Use a wedge under the mattress or blocks under the bed frame to raise the head of the bed. What foods are not recommended? The items listed may not be a complete list. Talk with your dietitian about what dietary choices are best for you. Grains Pastries or quick breads with added fat. [place] toast. Vegetables Deep fried vegetables. [place] fries. Any vegetables prepared with added fat. Any vegetables that cause symptoms. For some people this may include tomatoes and tomato products, chili peppers, onions and garlic, and horseradish. Fruits Any fruits prepared with added fat. Any fruits that cause symptoms. For some people this may include citrus fruits, such as oranges, grapefruit, pineapple, and lemons. Meats and other protein foods High-fat meats, such as fatty beef or pork, hot dogs, ribs, ham, sausage, salami and bacon. Fried meat or protein, including fried fish and fried chicken. Nuts and nut butters. Dairy Whole milk and chocolate milk. Sour cream. Cream. Ice cream. Cream cheese. Milk shakes. Beverages Coffee and tea, with or without caffeine. Carbonated beverages. Sodas. Energy drinks. Fruit juice made with acidic fruits (such as orange or grapefruit). Tomato juice. Alcoholic drinks. Fats and oils Butter. Margarine. Shortening. Ghee. Sweets and desserts Chocolate and cocoa. Donuts. Seasoning and other foods Pepper. Peppermint and spearmint.  Any condiments, herbs, or seasonings that cause symptoms. For some people, this may include curry, hot sauce, or vinegar-based salad dressings. Summary  When you have gastroesophageal reflux disease (GERD), food and lifestyle choices are very important to help ease the discomfort of GERD.  Eat frequent, small meals instead of three large meals each day. Eat your meals slowly, in a relaxed setting. Avoid bending over or lying down until 2-3 hours after eating.  Limit high-fat foods such as fatty meat or fried foods. This information is not intended to replace advice given to you by your health care provider. Make sure you discuss any questions you have with your health care provider. Document Revised: 07/16/2018 Document Reviewed: 03/26/2016 Elsevier Patient Education  2020 Elsevier Inc.     Dysphagia Eating Plan, Bite Size Food This diet plan is for people with moderate swallowing problems who have transitioned from pureed and minced foods. Bite size foods are soft and cut into small chunks so that they can be swallowed safely. On this eating plan, you may be instructed to drink liquids that are thickened. Work with your health care provider and your diet and nutrition specialist (dietitian) to make sure that you are following the diet safely and getting all the  nutrients you need. What are tips for following this plan? General guidelines for foods   You may eat foods that are tender, soft, and moist.  Always test food texture before taking a bite. Poke food with a fork or spoon to make sure it is tender.  Food should be easy to cut and shew. Avoid large pieces of food that require a lot of chewing.  Take small bites. Each bite should be smaller than your thumb nail (about 51mm by 15 mm).  If you were on pureed and minced food diet plans, you may eat any of the foods included in those diets.  Avoid foods that are very dry, hard, sticky, chewy, coarse, or crunchy.  If instructed by  your health care provider, thicken liquids. Follow your health care provider's instructions for what products to use, how to do this, and to what thickness. ? Your health care provider may recommend using a commercial thickener, rice cereal, or potato flakes. Ask your health care provider to recommend thickeners. ? Thickened liquids are usually a "pudding-like" consistency, or they may be as thick as honey or thick enough to eat with a spoon. Cooking  To moisten foods, you may add liquids while you are blending, mashing, or grinding your foods to the right consistency. These liquids include gravies, sauces, vegetable or fruit juice, milk, half and half, or water.  Strain extra liquid from foods before eating.  Reheat foods slowly to prevent a tough crust from forming.  Prepare foods in advance. Meal planning  Eat a variety of foods to get all the nutrients you need.  Some foods may be tolerated better than others. Work with your dietitian to identify which foods are safest for you to eat.  Follow your meal plan as told by your dietitian. What foods are allowed? Grains Moist breads without nuts or seeds. Biscuits, muffins, pancakes, and waffles that are well-moistened with syrup, jelly, margarine, or butter. Cooked cereals. Moist bread stuffing. Moist rice. Well-moistened cold cereal with small chunks. Well-cooked pasta, noodles, rice, and bread dressing in small pieces and thick sauce. Soft dumplings or spaetzle in small pieces and butter or gravy. Vegetables Soft, well-cooked vegetables in small pieces. Soft-cooked, mashed potatoes. Thickened vegetable juice. Fruits Canned or cooked fruits that are soft or moist and do not have skin or seeds. Fresh, soft bananas. Thickened fruit juices. Meat and other protein foods Tender, moist meats or poultry in small pieces. Moist meatballs or meatloaf. Fish without bones. Eggs or egg substitutes in small pieces. Tofu. Tempeh and meat alternatives in  small pieces. Well-cooked, tender beans, peas, baked beans, and other legumes. Dairy Thickened milk. Cream cheese. Yogurt. Cottage cheese. Sour cream. Small pieces of soft cheese. Fats and oils Butter. Oils. Margarine. Mayonnaise. Gravy. Spreads. Sweets and desserts Soft, smooth, moist desserts. Pudding. Custard. Moist cakes. Jam. Jelly. Honey. Preserves. Ask your health care provider whether you can have frozen desserts. Seasoning and other foods All seasonings and sweeteners. All sauces with small chunks. Prepared tuna, egg, or chicken salad without raw fruits or vegetables. Moist casseroles with small, tender pieces of meat. Soups with tender meat. What foods are not allowed? Grains Coarse or dry cereals. Dry breads. Toast. Crackers. Tough, crusty breads, such as [place] bread and baguettes. Dry pancakes, waffles, and muffins. Sticky rice. Dry bread stuffing. Granola. Popcorn. Chips. Vegetables All raw vegetables. Cooked corn. Rubbery or stiff cooked vegetables. Stringy vegetables, such as celery. Tough, crisp fried potatoes. Potato skins. Fruits Hard, crunchy, stringy, high-pulp,  and juicy raw fruits such as apples, pineapple, papaya, and watermelon. Small, round fruits, such as grapes. Dried fruit and fruit leather. Meat and other protein foods Large pieces of meat. Dry, tough meats, such as bacon, sausage, and hot dogs. Chicken, [place], or fish with skin and bones. Crunchy peanut butter. Nuts. Seeds. Nut and seed butters. Dairy Yogurt with nuts, seeds, or large chunks. Large chunks of cheese. Frozen desserts and milk consistency not allowed by your dietitian. Sweets and desserts Dry cakes. Chewy or dry cookies. Any desserts with nuts, seeds, dry fruits, coconut, pineapple, or anything dry, sticky, or hard. Chewy caramel. Licorice. Taffy-type candies. Ask your health care provider whether you can have frozen desserts. Seasoning and other foods Soups with tough or large chunks of meats,  poultry, or vegetables. Corn or clam chowder. Smoothies with large chunks of fruit. Summary  Bite size foods can be helpful for people with moderate swallowing problems.  On the dysphagia eating plan, you may eat foods that are soft, moist, and cut into pieces smaller than 24mm by 61mm.  You may be instructed to thicken liquids. Follow your health care provider's instructions about how to do this and to what consistency. This information is not intended to replace advice given to you by your health care provider. Make sure you discuss any questions you have with your health care provider. Document Revised: 07/16/2018 Document Reviewed: 07/05/2016 Elsevier Patient Education  2020 [employer].

## 2019-06-28 NOTE — Assessment & Plan Note (Signed)
Noted solid food dysphagia associated with exacerbated GERD which is now somewhat better controlled.  Further GERD recommendations as per above.  I will print out swallowing precautions for her to help reduce the incidence of solid food dysphagia.  No further pill dysphagia since Protonix.  We will plan for an upper endoscopy with possible dilation to further evaluate her GERD/dysphagia symptoms as well as help alleviate some of her dysphagia symptoms.  Further recommendations to follow.  Follow-up in 4 months.  Call for any worsening or severe symptoms.  Severe dysphagia/ER precautions given.  Proceed with EGD +/- dilation on propofol/MAC with Dr. Gala Romney in near future: the risks, benefits, and alternatives have been discussed with the patient in detail. The patient states understanding and desires to proceed.  The patient is currently on Wellbutrin and Lexapro.  The patient is not on any other anticoagulants, anxiolytics, chronic pain medications, antidepressants, antidiabetics, or iron supplements.  We will plan for the procedure on propofol/MAC to promote adequate sedation.

## 2019-06-28 NOTE — Assessment & Plan Note (Signed)
The patient was previously having frequent, significant GERD symptoms.  She was started on Protonix 40 mg daily and has had improvement.  However, she still having GERD symptoms about once a day.  She has identified some triggers and I will provide her with written patient information to help identify additional triggers that she can avoid.  I will also increase her Protonix to 40 mg twice daily for now.  Swallowing precautions as per below.  Follow-up in 4 months.  Call for any worsening or severe symptoms

## 2019-06-28 NOTE — H&P (View-Only) (Signed)
Primary Care Physician:  Assunta Found, MD Primary Gastroenterologist:  Dr. Jena Gauss  Chief Complaint  Patient presents with  . Gastroesophageal Reflux    some better since started Protonix  . Dysphagia  . Diverticulitis    09/2017    HPI:   Courtney Marshall is a 64 y.o. female who presents on referral from primary care on 06/14/2019 for GERD.  The patient has not been seen in our office since 2009.  Reviewed information provided with referral including office visit dated 06/08/2019 when the patient presented with difficulty swallowing.  At that time as noted her dysphagia symptoms were 3 to 4 weeks, solids only, somewhat improved with PPI.  No globus sensation.  Does admit heartburn/GERD.  No previous EGD.  Recommended GI referral.  Colonoscopy up-to-date 12/16/2017 by Dr. Franky Macho, which was a screening exam for colorectal cancer.  Findings included moderate diverticulosis in the sigmoid and descending colon, otherwise normal.  Recommended repeat colonoscopy in 10 years (2029).  No history of recent labs in our system.  Today he states she's doing ok overall. She states she was having GERD and dysphagia but improved somewhat (less frequent) since starting Protonix a couple weeks ago, 40 mg daily. Is on probiotic. She does have a history of acute diverticulitis in 2019. She currently is having GERD symptoms once a day but not every day; worsened by known; clearing her throat was previously frequent and much improved. Dysphagia with solid foods now occurring 1-2 times a week. not having pill dysphagia anymore since stopping large pills. Takes her time eating, chews frequently. Intermittent bloating and occasional/rare LLQ pain. Denies N/V, hematochezia, melena, fever, chills, unintentional weight loss. Denies URI or flu-like symptoms. Denies loss of sense of taste or smell. Was tested for COVID-19 about 3 months ago and was negative. She has had her first vaccine and is scheduled for her 2nd dose  in April. Denies chest pain, dyspnea, dizziness, lightheadedness, syncope, near syncope. Denies any other upper or lower GI symptoms.  Past Medical History:  Diagnosis Date  . Diverticulitis   . Elevated hemoglobin A1c   . Hyperlipidemia   . Hypertension   . PMB (postmenopausal bleeding) 11/03/2014   Had bleeding last year, after trying HRT by [facility], will get [place]  . Trouble in sleeping 11/03/2014  . Vaginal dryness 11/03/2014    Past Surgical History:  Procedure Laterality Date  . CESAREAN SECTION    . COLONOSCOPY N/A 12/16/2017   Procedure: COLONOSCOPY;  Surgeon: Franky Macho, MD;  Location: [facility];  Service: Gastroenterology;  Laterality: N/A;  . TONSILLECTOMY    . TUBAL LIGATION      Current Outpatient Medications  Medication Sig Dispense Refill  . buPROPion (WELLBUTRIN XL) 300 MG 24 hr tablet Take 300 mg by mouth daily.    . Cholecalciferol (VITAMIN D3) 5000 units CAPS Take 5,000 Units by mouth daily.    Marland Kitchen escitalopram (LEXAPRO) 5 MG tablet Take 5 mg by mouth daily.    Marland Kitchen losartan-hydrochlorothiazide (HYZAAR) 50-12.5 MG tablet Take 1 tablet by mouth daily.    . Multiple Vitamin (MULTIVITAMIN) tablet Take 1 tablet by mouth daily.    . pantoprazole (PROTONIX) 40 MG tablet Take 40 mg by mouth daily.    . Probiotic Product (ALIGN PO) Take by mouth daily.     No current facility-administered medications for this visit.    Allergies as of 06/28/2019 - Review Complete 06/28/2019  Allergen Reaction Noted  . Lisinopril Hives  10/25/2013  . Sulfa antibiotics  12/10/2017    Family History  Problem Relation Age of Onset  . Osteoporosis Mother   . Heart disease Father   . Hypertension Father   . Hyperlipidemia Father   . Osteoporosis Sister   . Osteoporosis Maternal Grandmother   . Other Maternal Grandfather        hit by train  . Heart attack Paternal Grandfather   . Colon cancer Neg Hx     Social History   Socioeconomic History  . Marital status:  Married    Spouse name: Not on file  . Number of children: Not on file  . Years of education: Not on file  . Highest education level: Not on file  Occupational History  . Not on file  Tobacco Use  . Smoking status: Never Smoker  . Smokeless tobacco: Never Used  Substance and Sexual Activity  . Alcohol use: No  . Drug use: No  . Sexual activity: Yes    Birth control/protection: Surgical, Post-menopausal    Comment: tubal  Other Topics Concern  . Not on file  Social History Narrative  . Not on file   Social Determinants of Health   Financial Resource Strain:   . Difficulty of Paying Living Expenses:   Food Insecurity:   . Worried About Running Out of Food in the Last Year:   . Ran Out of Food in the Last Year:   Transportation Needs:   . Lack of Transportation (Medical):   . Lack of Transportation (Non-Medical):   Physical Activity:   . Days of Exercise per Week:   . Minutes of Exercise per Session:   Stress:   . Feeling of Stress :   Social Connections:   . Frequency of Communication with Friends and Family:   . Frequency of Social Gatherings with Friends and Family:   . Attends Religious Services:   . Active Member of Clubs or Organizations:   . Attends Club or Organization Meetings:   . Marital Status:   Intimate Partner Violence:   . Fear of Current or Ex-Partner:   . Emotionally Abused:   . Physically Abused:   . Sexually Abused:     Review of Systems: General: Negative for anorexia, weight loss, fever, chills, fatigue, weakness. ENT: Negative for hoarseness, difficulty swallowing. CV: Negative for chest pain, angina, palpitations, peripheral edema.  Respiratory: Negative for dyspnea at rest, cough, sputum, wheezing.  GI: See history of present illness. MS: Negative for joint pain, low back pain.  Derm: Negative for rash or itching.  Endo: Negative for unusual weight change.  Heme: Negative for bruising or bleeding. Allergy: Negative for rash or  hives.    Physical Exam: BP (!) 147/88   Pulse 83   Temp (!) 97.1 F (36.2 C) (Temporal)   Ht 5' 4" (1.626 m)   Wt 189 lb 12.8 oz (86.1 kg)   BMI 32.58 kg/m  General:   Alert and oriented. Pleasant and cooperative. Well-nourished and well-developed.  Head:  Normocephalic and atraumatic. Eyes:  Without icterus, sclera clear and conjunctiva pink.  Ears:  Normal auditory acuity. Cardiovascular:  S1, S2 present without murmurs appreciated. Extremities without clubbing or edema. Respiratory:  Clear to auscultation bilaterally. No wheezes, rales, or rhonchi. No distress.  Gastrointestinal:  +BS, soft, non-tender and non-distended. No HSM noted. No guarding or rebound. No masses appreciated.  Rectal:  Deferred  Musculoskalatal:  Symmetrical without gross deformities. Skin:  Intact without significant lesions or rashes. Neurologic:    Alert and oriented x4;  grossly normal neurologically. Psych:  Alert and cooperative. Normal mood and affect. Heme/Lymph/Immune: No significant cervical adenopathy. No excessive bruising noted.    06/28/2019 9:59 AM   Disclaimer: This note was dictated with voice recognition software. Similar sounding words can inadvertently be transcribed and may not be corrected upon review.

## 2019-06-28 NOTE — Progress Notes (Signed)
Primary Care Physician:  Assunta Found, MD Primary Gastroenterologist:  Dr. Jena Gauss  Chief Complaint  Patient presents with  . Gastroesophageal Reflux    some better since started Protonix  . Dysphagia  . Diverticulitis    09/2017    HPI:   Courtney Marshall is a 64 y.o. female who presents on referral from primary care on 06/14/2019 for GERD.  The patient has not been seen in our office since 2009.  Reviewed information provided with referral including office visit dated 06/08/2019 when the patient presented with difficulty swallowing.  At that time as noted her dysphagia symptoms were 3 to 4 weeks, solids only, somewhat improved with PPI.  No globus sensation.  Does admit heartburn/GERD.  No previous EGD.  Recommended GI referral.  Colonoscopy up-to-date 12/16/2017 by Dr. Franky Macho, which was a screening exam for colorectal cancer.  Findings included moderate diverticulosis in the sigmoid and descending colon, otherwise normal.  Recommended repeat colonoscopy in 10 years (2029).  No history of recent labs in our system.  Today he states she's doing ok overall. She states she was having GERD and dysphagia but improved somewhat (less frequent) since starting Protonix a couple weeks ago, 40 mg daily. Is on probiotic. She does have a history of acute diverticulitis in 2019. She currently is having GERD symptoms once a day but not every day; worsened by known; clearing her throat was previously frequent and much improved. Dysphagia with solid foods now occurring 1-2 times a week. not having pill dysphagia anymore since stopping large pills. Takes her time eating, chews frequently. Intermittent bloating and occasional/rare LLQ pain. Denies N/V, hematochezia, melena, fever, chills, unintentional weight loss. Denies URI or flu-like symptoms. Denies loss of sense of taste or smell. Was tested for COVID-19 about 3 months ago and was negative. She has had her first vaccine and is scheduled for her 2nd dose  in April. Denies chest pain, dyspnea, dizziness, lightheadedness, syncope, near syncope. Denies any other upper or lower GI symptoms.  Past Medical History:  Diagnosis Date  . Diverticulitis   . Elevated hemoglobin A1c   . Hyperlipidemia   . Hypertension   . PMB (postmenopausal bleeding) 11/03/2014   Had bleeding last year, after trying HRT by [facility], will get [place]  . Trouble in sleeping 11/03/2014  . Vaginal dryness 11/03/2014    Past Surgical History:  Procedure Laterality Date  . CESAREAN SECTION    . COLONOSCOPY N/A 12/16/2017   Procedure: COLONOSCOPY;  Surgeon: Franky Macho, MD;  Location: [facility];  Service: Gastroenterology;  Laterality: N/A;  . TONSILLECTOMY    . TUBAL LIGATION      Current Outpatient Medications  Medication Sig Dispense Refill  . buPROPion (WELLBUTRIN XL) 300 MG 24 hr tablet Take 300 mg by mouth daily.    . Cholecalciferol (VITAMIN D3) 5000 units CAPS Take 5,000 Units by mouth daily.    Marland Kitchen escitalopram (LEXAPRO) 5 MG tablet Take 5 mg by mouth daily.    Marland Kitchen losartan-hydrochlorothiazide (HYZAAR) 50-12.5 MG tablet Take 1 tablet by mouth daily.    . Multiple Vitamin (MULTIVITAMIN) tablet Take 1 tablet by mouth daily.    . pantoprazole (PROTONIX) 40 MG tablet Take 40 mg by mouth daily.    . Probiotic Product (ALIGN PO) Take by mouth daily.     No current facility-administered medications for this visit.    Allergies as of 06/28/2019 - Review Complete 06/28/2019  Allergen Reaction Noted  . Lisinopril Hives  10/25/2013  . Sulfa antibiotics  12/10/2017    Family History  Problem Relation Age of Onset  . Osteoporosis Mother   . Heart disease Father   . Hypertension Father   . Hyperlipidemia Father   . Osteoporosis Sister   . Osteoporosis Maternal Grandmother   . Other Maternal Grandfather        hit by train  . Heart attack Paternal Grandfather   . Colon cancer Neg Hx     Social History   Socioeconomic History  . Marital status:  Married    Spouse name: Not on file  . Number of children: Not on file  . Years of education: Not on file  . Highest education level: Not on file  Occupational History  . Not on file  Tobacco Use  . Smoking status: Never Smoker  . Smokeless tobacco: Never Used  Substance and Sexual Activity  . Alcohol use: No  . Drug use: No  . Sexual activity: Yes    Birth control/protection: Surgical, Post-menopausal    Comment: tubal  Other Topics Concern  . Not on file  Social History Narrative  . Not on file   Social Determinants of Health   Financial Resource Strain:   . Difficulty of Paying Living Expenses:   Food Insecurity:   . Worried About Programme researcher, broadcasting/film/video in the Last Year:   . Barista in the Last Year:   Transportation Needs:   . Freight forwarder (Medical):   Marland Kitchen Lack of Transportation (Non-Medical):   Physical Activity:   . Days of Exercise per Week:   . Minutes of Exercise per Session:   Stress:   . Feeling of Stress :   Social Connections:   . Frequency of Communication with Friends and Family:   . Frequency of Social Gatherings with Friends and Family:   . Attends Religious Services:   . Active Member of Clubs or Organizations:   . Attends Banker Meetings:   Marland Kitchen Marital Status:   Intimate Partner Violence:   . Fear of Current or Ex-Partner:   . Emotionally Abused:   Marland Kitchen Physically Abused:   . Sexually Abused:     Review of Systems: General: Negative for anorexia, weight loss, fever, chills, fatigue, weakness. ENT: Negative for hoarseness, difficulty swallowing. CV: Negative for chest pain, angina, palpitations, peripheral edema.  Respiratory: Negative for dyspnea at rest, cough, sputum, wheezing.  GI: See history of present illness. MS: Negative for joint pain, low back pain.  Derm: Negative for rash or itching.  Endo: Negative for unusual weight change.  Heme: Negative for bruising or bleeding. Allergy: Negative for rash or  hives.    Physical Exam: BP (!) 147/88   Pulse 83   Temp (!) 97.1 F (36.2 C) (Temporal)   Ht 5\' 4"  (1.626 m)   Wt 189 lb 12.8 oz (86.1 kg)   BMI 32.58 kg/m  General:   Alert and oriented. Pleasant and cooperative. Well-nourished and well-developed.  Head:  Normocephalic and atraumatic. Eyes:  Without icterus, sclera clear and conjunctiva pink.  Ears:  Normal auditory acuity. Cardiovascular:  S1, S2 present without murmurs appreciated. Extremities without clubbing or edema. Respiratory:  Clear to auscultation bilaterally. No wheezes, rales, or rhonchi. No distress.  Gastrointestinal:  +BS, soft, non-tender and non-distended. No HSM noted. No guarding or rebound. No masses appreciated.  Rectal:  Deferred  Musculoskalatal:  Symmetrical without gross deformities. Skin:  Intact without significant lesions or rashes. Neurologic:  Alert and oriented x4;  grossly normal neurologically. Psych:  Alert and cooperative. Normal mood and affect. Heme/Lymph/Immune: No significant cervical adenopathy. No excessive bruising noted.    06/28/2019 9:59 AM   Disclaimer: This note was dictated with voice recognition software. Similar sounding words can inadvertently be transcribed and may not be corrected upon review.

## 2019-06-28 NOTE — Patient Instructions (Signed)
Courtney Marshall  06/28/2019     @PREFPERIOPPHARMACY @   Your procedure is scheduled on  07/01/2019 .  Report to 07/03/2019 at  1315 (1:15)  P.M.  Call this number if you have problems the morning of surgery:  [phone]   Remember:  Follow the diet instructions given to you by Dr [phone] office.                      Take these medicines the morning of surgery with A SIP OF WATER  Bupropion, lexapro, protonix.    Do not wear jewelry, make-up or nail polish.  Do not wear lotions, powders, or perfumes. Please wear deodorant and brush your teeth.  Do not shave 48 hours prior to surgery.  Men may shave face and neck.  Do not bring valuables to the hospital.  [facility] is not responsible for any belongings or valuables.  Contacts, dentures or bridgework may not be worn into surgery.  Leave your suitcase in the car.  After surgery it may be brought to your room.  For patients admitted to the hospital, discharge time will be determined by your treatment team.  Patients discharged the day of surgery will not be allowed to drive home.   Name and phone number of your driver:   family Special instructions:  DO NOT smoke the day of your procedure.  Please read over the following fact sheets that you were given. Anesthesia Post-op Instructions and Care and Recovery After Surgery       Upper Endoscopy, Adult, Care After This sheet gives you information about how to care for yourself after your procedure. Your health care provider may also give you more specific instructions. If you have problems or questions, contact your health care provider. What can I expect after the procedure? After the procedure, it is common to have:  A sore throat.  Mild stomach pain or discomfort.  Bloating.  Nausea. Follow these instructions at home:   Follow instructions from your health care provider about what to eat or drink after your procedure.  Return to your normal activities  as told by your health care provider. Ask your health care provider what activities are safe for you.  Take over-the-counter and prescription medicines only as told by your health care provider.  Do not drive for 24 hours if you were given a sedative during your procedure.  Keep all follow-up visits as told by your health care provider. This is important. Contact a health care provider if you have:  A sore throat that lasts longer than one day.  Trouble swallowing. Get help right away if:  You vomit blood or your vomit looks like coffee grounds.  You have: ? A fever. ? Bloody, black, or tarry stools. ? A severe sore throat or you cannot swallow. ? Difficulty breathing. ? Severe pain in your chest or abdomen. Summary  After the procedure, it is common to have a sore throat, mild stomach discomfort, bloating, and nausea.  Do not drive for 24 hours if you were given a sedative during the procedure.  Follow instructions from your health care provider about what to eat or drink after your procedure.  Return to your normal activities as told by your health care provider. This information is not intended to replace advice given to you by your health care provider. Make sure you discuss any questions you have with your health care provider.  Document Revised: 09/16/2017 Document Reviewed: 08/25/2017 Elsevier Patient Education  [place].  Esophageal Dilatation Esophageal dilatation, also called esophageal dilation, is a procedure to widen or open (dilate) a blocked or narrowed part of the esophagus. The esophagus is the part of the body that moves food and liquid from the mouth to the stomach. You may need this procedure if:  You have a buildup of scar tissue in your esophagus that makes it difficult, painful, or impossible to swallow. This can be caused by gastroesophageal reflux disease (GERD).  You have cancer of the esophagus.  There is a problem with how food moves  through your esophagus. In some cases, you may need this procedure repeated at a later time to dilate the esophagus gradually. Tell a health care provider about:  Any allergies you have.  All medicines you are taking, including vitamins, herbs, eye drops, creams, and over-the-counter medicines.  Any problems you or family members have had with anesthetic medicines.  Any blood disorders you have.  Any surgeries you have had.  Any medical conditions you have.  Any antibiotic medicines you are required to take before dental procedures.  Whether you are pregnant or may be pregnant. What are the risks? Generally, this is a safe procedure. However, problems may occur, including:  Bleeding due to a tear in the lining of the esophagus.  A hole (perforation) in the esophagus. What happens before the procedure?  Follow instructions from your health care provider about eating or drinking restrictions.  Ask your health care provider about changing or stopping your regular medicines. This is especially important if you are taking diabetes medicines or blood thinners.  Plan to have someone take you home from the hospital or clinic.  Plan to have a responsible adult care for you for at least 24 hours after you leave the hospital or clinic. This is important. What happens during the procedure?  You may be given a medicine to help you relax (sedative).  A numbing medicine may be sprayed into the back of your throat, or you may gargle the medicine.  Your health care provider may perform the dilatation using various surgical instruments, such as: ? Simple dilators. This instrument is carefully placed in the esophagus to stretch it. ? Guided wire bougies. This involves using an endoscope to insert a wire into the esophagus. A dilator is passed over this wire to enlarge the esophagus. Then the wire is removed. ? Balloon dilators. An endoscope with a small balloon at the end is inserted into the  esophagus. The balloon is inflated to stretch the esophagus and open it up. The procedure may vary among health care providers and hospitals. What happens after the procedure?  Your blood pressure, heart rate, breathing rate, and blood oxygen level will be monitored until the medicines you were given have worn off.  Your throat may feel slightly sore and numb. This will improve slowly over time.  You will not be allowed to eat or drink until your throat is no longer numb.  When you are able to drink, urinate, and sit on the edge of the bed without nausea or dizziness, you may be able to return home. Follow these instructions at home:  Take over-the-counter and prescription medicines only as told by your health care provider.  Do not drive for 24 hours if you were given a sedative during your procedure.  You should have a responsible adult with you for 24 hours after the procedure.  Follow  instructions from your health care provider about any eating or drinking restrictions.  Do not use any products that contain nicotine or tobacco, such as cigarettes and e-cigarettes. If you need help quitting, ask your health care provider.  Keep all follow-up visits as told by your health care provider. This is important. Get help right away if you:  Have a fever.  Have chest pain.  Have pain that is not relieved by medication.  Have trouble breathing.  Have trouble swallowing.  Vomit blood. Summary  Esophageal dilatation, also called esophageal dilation, is a procedure to widen or open (dilate) a blocked or narrowed part of the esophagus.  Plan to have someone take you home from the hospital or clinic.  For this procedure, a numbing medicine may be sprayed into the back of your throat, or you may gargle the medicine.  Do not drive for 24 hours if you were given a sedative during your procedure. This information is not intended to replace advice given to you by your health care  provider. Make sure you discuss any questions you have with your health care provider. Document Revised: 01/20/2019 Document Reviewed: 01/28/2017 Elsevier Patient Education  2020 [place] After These instructions provide you with information about caring for yourself after your procedure. Your health care provider may also give you more specific instructions. Your treatment has been planned according to current medical practices, but problems sometimes occur. Call your health care provider if you have any problems or questions after your procedure. What can I expect after the procedure? After your procedure, you may:  Feel sleepy for several hours.  Feel clumsy and have poor balance for several hours.  Feel forgetful about what happened after the procedure.  Have poor judgment for several hours.  Feel nauseous or vomit.  Have a sore throat if you had a breathing tube during the procedure. Follow these instructions at home: For at least 24 hours after the procedure:      Have a responsible adult stay with you. It is important to have someone help care for you until you are awake and alert.  Rest as needed.  Do not: ? Participate in activities in which you could fall or become injured. ? Drive. ? Use heavy machinery. ? Drink alcohol. ? Take sleeping pills or medicines that cause drowsiness. ? Make important decisions or sign legal documents. ? Take care of children on your own. Eating and drinking  Follow the diet that is recommended by your health care provider.  If you vomit, drink water, juice, or soup when you can drink without vomiting.  Make sure you have little or no nausea before eating solid foods. General instructions  Take over-the-counter and prescription medicines only as told by your health care provider.  If you have sleep apnea, surgery and certain medicines can increase your risk for breathing problems. Follow  instructions from your health care provider about wearing your sleep device: ? Anytime you are sleeping, including during daytime naps. ? While taking prescription pain medicines, sleeping medicines, or medicines that make you drowsy.  If you smoke, do not smoke without supervision.  Keep all follow-up visits as told by your health care provider. This is important. Contact a health care provider if:  You keep feeling nauseous or you keep vomiting.  You feel light-headed.  You develop a rash.  You have a fever. Get help right away if:  You have trouble breathing. Summary  For several hours  after your procedure, you may feel sleepy and have poor judgment.  Have a responsible adult stay with you for at least 24 hours or until you are awake and alert. This information is not intended to replace advice given to you by your health care provider. Make sure you discuss any questions you have with your health care provider. Document Revised: 06/23/2017 Document Reviewed: 07/16/2015 Elsevier Patient Education  [place].

## 2019-06-28 NOTE — Telephone Encounter (Signed)
Called patient. She is aware covid test scheduled for 3/23 at 2:30pm, pre-op appt scheduled for 3/24 at 11:30am. Nothing further needed

## 2019-06-29 ENCOUNTER — Other Ambulatory Visit (HOSPITAL_COMMUNITY)
Admission: RE | Admit: 2019-06-29 | Discharge: 2019-06-29 | Disposition: A | Discharge status: Home / Self Care | Payer: BC Managed Care – PPO | Source: Ambulatory Visit | Attending: Internal Medicine | Admitting: Internal Medicine

## 2019-06-29 DIAGNOSIS — Z01812 Encounter for preprocedural laboratory examination: Secondary | ICD-10-CM | POA: Diagnosis not present

## 2019-06-29 DIAGNOSIS — Z20822 Contact with and (suspected) exposure to covid-19: Secondary | ICD-10-CM | POA: Diagnosis not present

## 2019-06-29 LAB — SARS CORONAVIRUS 2 (TAT 6-24 HRS): SARS Coronavirus 2: NEGATIVE

## 2019-06-30 ENCOUNTER — Other Ambulatory Visit: Payer: Self-pay

## 2019-06-30 ENCOUNTER — Encounter (HOSPITAL_COMMUNITY): Payer: Self-pay

## 2019-06-30 ENCOUNTER — Encounter (HOSPITAL_COMMUNITY)
Admission: RE | Admit: 2019-06-30 | Discharge: 2019-06-30 | Disposition: A | Discharge status: Home / Self Care | Payer: BC Managed Care – PPO | Source: Ambulatory Visit | Attending: Internal Medicine | Admitting: Internal Medicine

## 2019-06-30 ENCOUNTER — Telehealth: Payer: Self-pay | Admitting: *Deleted

## 2019-06-30 DIAGNOSIS — Z01812 Encounter for preprocedural laboratory examination: Secondary | ICD-10-CM | POA: Diagnosis present

## 2019-06-30 HISTORY — DX: Unspecified osteoarthritis, unspecified site: M19.90

## 2019-06-30 HISTORY — DX: Gastro-esophageal reflux disease without esophagitis: K21.9

## 2019-06-30 HISTORY — DX: Anxiety disorder, unspecified: F41.9

## 2019-06-30 LAB — BASIC METABOLIC PANEL
Anion gap: 11 (ref 5–15)
BUN: 18 mg/dL (ref 8–23)
CO2: 24 mmol/L (ref 22–32)
Calcium: 9.4 mg/dL (ref 8.9–10.3)
Chloride: 103 mmol/L (ref 98–111)
Creatinine, Ser: 0.6 mg/dL (ref 0.44–1.00)
GFR calc Af Amer: >60 mL/min (ref >60)
GFR calc non Af Amer: >60 mL/min (ref >60)
Glucose, Bld: 84 mg/dL (ref 70–99)
Potassium: 3.5 mmol/L (ref 3.5–5.1)
Sodium: 138 mmol/L (ref 135–145)

## 2019-06-30 NOTE — Telephone Encounter (Signed)
Called pt. Offered sooner appt time for procedure time tomorrow. She was agreeable. Procedure changed to 8:45am. Patient aware she can have clear liquids until midnight and then NPO midnight. She is going to pre-op today. Advised they will let her know what time to arrive for procedure. Endo aware of appt change

## 2019-07-01 ENCOUNTER — Encounter (HOSPITAL_COMMUNITY): Payer: Self-pay | Admitting: Internal Medicine

## 2019-07-01 ENCOUNTER — Ambulatory Visit (HOSPITAL_COMMUNITY): Payer: BC Managed Care – PPO | Admitting: Anesthesiology

## 2019-07-01 ENCOUNTER — Encounter: Payer: Self-pay | Admitting: Internal Medicine

## 2019-07-01 ENCOUNTER — Ambulatory Visit (HOSPITAL_COMMUNITY)
Admission: RE | Admit: 2019-07-01 | Discharge: 2019-07-01 | Disposition: A | Discharge status: Home / Self Care | Payer: BC Managed Care – PPO | Attending: Internal Medicine | Admitting: Internal Medicine

## 2019-07-01 ENCOUNTER — Encounter (HOSPITAL_COMMUNITY)
Admission: RE | Disposition: A | Discharge status: Home / Self Care | Payer: Self-pay | Source: Home / Self Care | Attending: Internal Medicine

## 2019-07-01 DIAGNOSIS — K222 Esophageal obstruction: Secondary | ICD-10-CM

## 2019-07-01 DIAGNOSIS — I10 Essential (primary) hypertension: Secondary | ICD-10-CM | POA: Diagnosis not present

## 2019-07-01 DIAGNOSIS — K2210 Ulcer of esophagus without bleeding: Secondary | ICD-10-CM | POA: Insufficient documentation

## 2019-07-01 DIAGNOSIS — K219 Gastro-esophageal reflux disease without esophagitis: Secondary | ICD-10-CM | POA: Diagnosis not present

## 2019-07-01 DIAGNOSIS — Z79899 Other long term (current) drug therapy: Secondary | ICD-10-CM | POA: Insufficient documentation

## 2019-07-01 DIAGNOSIS — R1310 Dysphagia, unspecified: Secondary | ICD-10-CM

## 2019-07-01 DIAGNOSIS — K449 Diaphragmatic hernia without obstruction or gangrene: Secondary | ICD-10-CM | POA: Insufficient documentation

## 2019-07-01 HISTORY — PX: ESOPHAGOGASTRODUODENOSCOPY (EGD) WITH PROPOFOL: SHX5813

## 2019-07-01 HISTORY — PX: MALONEY DILATION: SHX5535

## 2019-07-01 SURGERY — ESOPHAGOGASTRODUODENOSCOPY (EGD) WITH PROPOFOL
Anesthesia: General

## 2019-07-01 MED ORDER — KETAMINE HCL 50 MG/5ML IJ SOSY
PREFILLED_SYRINGE | INTRAMUSCULAR | Status: AC
  Filled 2019-07-01: qty 5

## 2019-07-01 MED ORDER — LACTATED RINGERS IV SOLN
INTRAVENOUS | Status: DC
  Administered 2019-07-01: 1000 mL via INTRAVENOUS

## 2019-07-01 MED ORDER — CHLORHEXIDINE GLUCONATE CLOTH 2 % EX PADS: 6.0000 | MEDICATED_PAD | Freq: Once | CUTANEOUS | Status: DC

## 2019-07-01 MED ORDER — PROPOFOL 10 MG/ML IV BOLUS
INTRAVENOUS | Status: DC | PRN
  Administered 2019-07-01: 60 mg via INTRAVENOUS
  Administered 2019-07-01: 40 mg via INTRAVENOUS

## 2019-07-01 MED ORDER — KETAMINE HCL 10 MG/ML IJ SOLN
INTRAMUSCULAR | Status: DC | PRN
  Administered 2019-07-01: 15 mg via INTRAVENOUS

## 2019-07-01 MED ORDER — PROPOFOL 500 MG/50ML IV EMUL
INTRAVENOUS | Status: DC | PRN
  Administered 2019-07-01: 150 ug/kg/min via INTRAVENOUS

## 2019-07-01 NOTE — Anesthesia Postprocedure Evaluation (Signed)
Anesthesia Post Note  Patient: Courtney Marshall  Procedure(s) Performed: ESOPHAGOGASTRODUODENOSCOPY (EGD) WITH PROPOFOL (N/A ) MALONEY DILATION (N/A )  Patient location during evaluation: Phase II Anesthesia Type: General Level of consciousness: awake and alert and patient cooperative Pain management: satisfactory to patient Vital Signs Assessment: post-procedure vital signs reviewed and stable Respiratory status: spontaneous breathing Cardiovascular status: stable Postop Assessment: no apparent nausea or vomiting Anesthetic complications: no     Last Vitals:  Vitals:   07/01/19 0826 07/01/19 0830  BP: 137/76 125/73  Pulse: 95 87  Resp: 14 13  Temp: 36.7 C   SpO2: 98% 97%    Last Pain:  Vitals:   07/01/19 0826  TempSrc:   PainSc: 0-No pain                 Keontae Levingston

## 2019-07-01 NOTE — Discharge Instructions (Signed)
EGD Discharge instructions Please read the instructions outlined below and refer to this sheet in the next few weeks. These discharge instructions provide you with general information on caring for yourself after you leave the hospital. Your doctor may also give you specific instructions. While your treatment has been planned according to the most current medical practices available, unavoidable complications occasionally occur. If you have any problems or questions after discharge, please call your doctor. ACTIVITY  You may resume your regular activity but move at a slower pace for the next 24 hours.   Take frequent rest periods for the next 24 hours.   Walking will help expel (get rid of) the air and reduce the bloated feeling in your abdomen.   No driving for 24 hours (because of the anesthesia (medicine) used during the test).   You may shower.   Do not sign any important legal documents or operate any machinery for 24 hours (because of the anesthesia used during the test).  NUTRITION  Drink plenty of fluids.   You may resume your normal diet.   Begin with a light meal and progress to your normal diet.   Avoid alcoholic beverages for 24 hours or as instructed by your caregiver.  MEDICATIONS  You may resume your normal medications unless your caregiver tells you otherwise.  WHAT YOU CAN EXPECT TODAY  You may experience abdominal discomfort such as a feeling of fullness or "gas" pains.  FOLLOW-UP  Your doctor will discuss the results of your test with you.  SEEK IMMEDIATE MEDICAL ATTENTION IF ANY OF THE FOLLOWING OCCUR:  Excessive nausea (feeling sick to your stomach) and/or vomiting.   Severe abdominal pain and distention (swelling).   Trouble swallowing.   Temperature over 101 F (37.8 C).   Rectal bleeding or vomiting of blood.    GERD information provided  Continue Protonix 40 mg twice daily  Office visit with [place] in 3 months (EG)  At patient request I  called Mr. Christenberry at [phone] and left message with findings on answering service    Gastroesophageal Reflux Disease, Adult Gastroesophageal reflux (GER) happens when acid from the stomach flows up into the tube that connects the mouth and the stomach (esophagus). Normally, food travels down the esophagus and stays in the stomach to be digested. With GER, food and stomach acid sometimes move back up into the esophagus. You may have a disease called gastroesophageal reflux disease (GERD) if the reflux:  Happens often.  Causes frequent or very bad symptoms.  Causes problems such as damage to the esophagus. When this happens, the esophagus becomes sore and swollen (inflamed). Over time, GERD can make small holes (ulcers) in the lining of the esophagus. What are the causes? This condition is caused by a problem with the muscle between the esophagus and the stomach. When this muscle is weak or not normal, it does not close properly to keep food and acid from coming back up from the stomach. The muscle can be weak because of:  Tobacco use.  Pregnancy.  Having a certain type of hernia (hiatal hernia).  Alcohol use.  Certain foods and drinks, such as coffee, chocolate, onions, and peppermint. What increases the risk? You are more likely to develop this condition if you:  Are overweight.  Have a disease that affects your connective tissue.  Use NSAID medicines. What are the signs or symptoms? Symptoms of this condition include:  Heartburn.  Difficult or painful swallowing.  The feeling of having a lump in  the throat.  A bitter taste in the mouth.  Bad breath.  Having a lot of saliva.  Having an upset or bloated stomach.  Belching.  Chest pain. Different conditions can cause chest pain. Make sure you see your doctor if you have chest pain.  Shortness of breath or noisy breathing (wheezing).  Ongoing (chronic) cough or a cough at night.  Wearing away of the surface  of teeth (tooth enamel).  Weight loss. How is this treated? Treatment will depend on how bad your symptoms are. Your doctor may suggest:  Changes to your diet.  Medicine.  Surgery. Follow these instructions at home: Eating and drinking   Follow a diet as told by your doctor. You may need to avoid foods and drinks such as: ? Coffee and tea (with or without caffeine). ? Drinks that contain alcohol. ? Energy drinks and sports drinks. ? Bubbly (carbonated) drinks or sodas. ? Chocolate and cocoa. ? Peppermint and mint flavorings. ? Garlic and onions. ? Horseradish. ? Spicy and acidic foods. These include peppers, chili powder, curry powder, vinegar, hot sauces, and BBQ sauce. ? Citrus fruit juices and citrus fruits, such as oranges, lemons, and limes. ? Tomato-based foods. These include red sauce, chili, salsa, and pizza with red sauce. ? Fried and fatty foods. These include donuts, french fries, potato chips, and high-fat dressings. ? High-fat meats. These include hot dogs, rib eye steak, sausage, ham, and bacon. ? High-fat dairy items, such as whole milk, butter, and cream cheese.  Eat small meals often. Avoid eating large meals.  Avoid drinking large amounts of liquid with your meals.  Avoid eating meals during the 2-3 hours before bedtime.  Avoid lying down right after you eat.  Do not exercise right after you eat. Lifestyle   Do not use any products that contain nicotine or tobacco. These include cigarettes, e-cigarettes, and chewing tobacco. If you need help quitting, ask your doctor.  Try to lower your stress. If you need help doing this, ask your doctor.  If you are overweight, lose an amount of weight that is healthy for you. Ask your doctor about a safe weight loss goal. General instructions  Pay attention to any changes in your symptoms.  Take over-the-counter and prescription medicines only as told by your doctor. Do not take aspirin, ibuprofen, or other  NSAIDs unless your doctor says it is okay.  Wear loose clothes. Do not wear anything tight around your waist.  Raise (elevate) the head of your bed about 6 inches (15 cm).  Avoid bending over if this makes your symptoms worse.  Keep all follow-up visits as told by your doctor. This is important. Contact a doctor if:  You have new symptoms.  You lose weight and you do not know why.  You have trouble swallowing or it hurts to swallow.  You have wheezing or a cough that keeps happening.  Your symptoms do not get better with treatment.  You have a hoarse voice. Get help right away if:  You have pain in your arms, neck, jaw, teeth, or back.  You feel sweaty, dizzy, or light-headed.  You have chest pain or shortness of breath.  You throw up (vomit) and your throw-up looks like blood or coffee grounds.  You pass out (faint).  Your poop (stool) is bloody or black.  You cannot swallow, drink, or eat. Summary  If a person has gastroesophageal reflux disease (GERD), food and stomach acid move back up into the esophagus and cause  symptoms or problems such as damage to the esophagus.  Treatment will depend on how bad your symptoms are.  Follow a diet as told by your doctor.  Take all medicines only as told by your doctor. This information is not intended to replace advice given to you by your health care provider. Make sure you discuss any questions you have with your health care provider. Document Revised: 10/01/2017 Document Reviewed: 10/01/2017 Elsevier Patient Education  2020 [employer]     Monitored Anesthesia Care, Care After These instructions provide you with information about caring for yourself after your procedure. Your health care provider may also give you more specific instructions. Your treatment has been planned according to current medical practices, but problems sometimes occur. Call your health care provider if you have any problems or questions after  your procedure. What can I expect after the procedure? After your procedure, you may:  Feel sleepy for several hours.  Feel clumsy and have poor balance for several hours.  Feel forgetful about what happened after the procedure.  Have poor judgment for several hours.  Feel nauseous or vomit.  Have a sore throat if you had a breathing tube during the procedure. Follow these instructions at home: For at least 24 hours after the procedure:      Have a responsible adult stay with you. It is important to have someone help care for you until you are awake and alert.  Rest as needed.  Do not: ? Participate in activities in which you could fall or become injured. ? Drive. ? Use heavy machinery. ? Drink alcohol. ? Take sleeping pills or medicines that cause drowsiness. ? Make important decisions or sign legal documents. ? Take care of children on your own. Eating and drinking  Follow the diet that is recommended by your health care provider.  If you vomit, drink water, juice, or soup when you can drink without vomiting.  Make sure you have little or no nausea before eating solid foods. General instructions  Take over-the-counter and prescription medicines only as told by your health care provider.  If you have sleep apnea, surgery and certain medicines can increase your risk for breathing problems. Follow instructions from your health care provider about wearing your sleep device: ? Anytime you are sleeping, including during daytime naps. ? While taking prescription pain medicines, sleeping medicines, or medicines that make you drowsy.  If you smoke, do not smoke without supervision.  Keep all follow-up visits as told by your health care provider. This is important. Contact a health care provider if:  You keep feeling nauseous or you keep vomiting.  You feel light-headed.  You develop a rash.  You have a fever. Get help right away if:  You have trouble  breathing. Summary  For several hours after your procedure, you may feel sleepy and have poor judgment.  Have a responsible adult stay with you for at least 24 hours or until you are awake and alert. This information is not intended to replace advice given to you by your health care provider. Make sure you discuss any questions you have with your health care provider. Document Revised: 06/23/2017 Document Reviewed: 07/16/2015 Elsevier Patient Education  2020 [employer].

## 2019-07-01 NOTE — Anesthesia Procedure Notes (Signed)
Date/Time: 07/01/2019 8:02 AM Performed by: Franco Nones, CRNA Pre-anesthesia Checklist: Patient identified, Emergency Drugs available, Suction available, Timeout performed and Patient being monitored Patient Re-evaluated:Patient Re-evaluated prior to induction Oxygen Delivery Method: Nasal Cannula

## 2019-07-01 NOTE — Anesthesia Preprocedure Evaluation (Signed)
Anesthesia Evaluation  Patient identified by MRN, date of birth, ID band Patient awake    Reviewed: Allergy & Precautions, H&P , NPO status , Patient's Chart, lab work & pertinent test results, reviewed documented beta blocker date and time   Airway Mallampati: II  TM Distance: >3 FB Neck ROM: full    Dental   Pulmonary neg pulmonary ROS,    breath sounds clear to auscultation       Cardiovascular hypertension, negative cardio ROS   Rhythm:regular Rate:Normal     Neuro/Psych negative neurological ROS  negative psych ROS   GI/Hepatic Neg liver ROS, GERD  Medicated,  Endo/Other  negative endocrine ROS  Renal/GU negative Renal ROS  negative genitourinary   Musculoskeletal   Abdominal   Peds  Hematology negative hematology ROS (+)   Anesthesia Other Findings   Reproductive/Obstetrics negative OB ROS                             Anesthesia Physical Anesthesia Plan  ASA: II  Anesthesia Plan: General   Post-op Pain Management:    Induction:   PONV Risk Score and Plan: 2 and Propofol infusion  Airway Management Planned:   Additional Equipment:   Intra-op Plan:   Post-operative Plan:   Informed Consent: I have reviewed the patients History and Physical, chart, labs and discussed the procedure including the risks, benefits and alternatives for the proposed anesthesia with the patient or authorized representative who has indicated his/her understanding and acceptance.       Plan Discussed with:   Anesthesia Plan Comments:         Anesthesia Quick Evaluation

## 2019-07-01 NOTE — Op Note (Signed)
[facility] Patient Name: Courtney Marshall Procedure Date: 07/01/2019 7:51 AM MRN: [id number] Date of Birth: 1956-03-24 Attending MD: Gennette Pac , MD CSN: [id number] Age: 64 Admit Type: Outpatient Procedure:                Upper GI endoscopy Indications:              Dysphagia Providers:                Gennette Pac, MD, Criselda Peaches. Patsy Lager, RN,                            Edythe Clarity, Technician Referring MD:              Medicines:                Propofol per Anesthesia Complications:            No immediate complications. Estimated Blood Loss:     Estimated blood loss was minimal. Procedure:                Pre-Anesthesia Assessment:                           - Prior to the procedure, a History and Physical                            was performed, and patient medications and                            allergies were reviewed. The patient's tolerance of                            previous anesthesia was also reviewed. The risks                            and benefits of the procedure and the sedation                            options and risks were discussed with the patient.                            All questions were answered, and informed consent                            was obtained. Prior Anticoagulants: The patient has                            taken no previous anticoagulant or antiplatelet                            agents. ASA Grade Assessment: II - A patient with                            mild systemic disease. After reviewing the risks  and benefits, the patient was deemed in                            satisfactory condition to undergo the procedure.                           After obtaining informed consent, the endoscope was                            passed under direct vision. Throughout the                            procedure, the patient's blood pressure, pulse, and                            oxygen saturations were  monitored continuously. The                            GIF-H190 ([id number]) scope was introduced through the                            mouth, and advanced to the second part of duodenum.                            The upper GI endoscopy was accomplished without                            difficulty. The patient tolerated the procedure                            well. Scope In: 8:10:19 AM Scope Out: 8:18:08 AM Total Procedure Duration: 0 hours 7 minutes 49 seconds  Findings:      A mild Schatzki ring was found at the gastroesophageal junction. Single       5 mm erosion present at the junction. No Barrett's epithelium seen.      A small hiatal hernia was present.      The stomach was otherwise without abnormality.      The duodenal bulb and second portion of the duodenum were normal. The       scope was withdrawn. Dilation was performed with a Maloney dilator with       mild resistance at 54 Fr. The dilation site was examined following       endoscope reinsertion and showed no change. Estimated blood loss: none.       Subsequently, I took the cold biopsy forceps and took four-quadrant       "bites" of the ring to disrupted. This was done effectively without       apparent complication. Impression:               -Mild erosive reflux esophagitis mild Schatzki                            ring. Dilated /disrupted.                           - Small  hiatal hernia.                           - The examination was otherwise normal.                           - Normal duodenal bulb and second portion of the                            duodenum.                           - No specimens collected. Moderate Sedation:      Moderate (conscious) sedation was personally administered by an       anesthesia professional. The following parameters were monitored: oxygen       saturation, heart rate, blood pressure, and response to care. Recommendation:           - Patient has a contact number available for                             emergencies. The signs and symptoms of potential                            delayed complications were discussed with the                            patient. Return to normal activities tomorrow.                            Written discharge instructions were provided to the                            patient.                           - Advance diet as tolerated. Continue Protonix 40                            mg twice daily. Office visit with [place] in 3 months Procedure Code(s):        --- Professional ---                           [phone], Esophagogastroduodenoscopy, flexible,                            transoral; diagnostic, including collection of                            specimen(s) by brushing or washing, when performed                            (separate procedure)                           43450, Dilation of esophagus, by unguided sound or  bougie, single or multiple passes Diagnosis Code(s):        --- Professional ---                           K22.2, Esophageal obstruction                           K44.9, Diaphragmatic hernia without obstruction or                            gangrene                           R13.10, Dysphagia, unspecified CPT copyright 2019 American Medical Association. All rights reserved. The codes documented in this report are preliminary and upon coder review may  be revised to meet current compliance requirements. Cristopher Estimable. Lolitha Tortora, MD Norvel Richards, MD 07/01/2019 8:33:01 AM This report has been signed electronically. Number of Addenda: 0

## 2019-07-01 NOTE — Transfer of Care (Signed)
Immediate Anesthesia Transfer of Care Note  Patient: Courtney Marshall  Procedure(s) Performed: ESOPHAGOGASTRODUODENOSCOPY (EGD) WITH PROPOFOL (N/A ) MALONEY DILATION (N/A )  Patient Location: PACU  Anesthesia Type:General  Level of Consciousness: awake and patient cooperative  Airway & Oxygen Therapy: Patient Spontanous Breathing  Post-op Assessment: Report given to RN and Post -op Vital signs reviewed and stable  Post vital signs: Reviewed and stable  Last Vitals:  Vitals Value Taken Time  BP    Temp 98.0   Pulse 97 07/01/19 0824  Resp    SpO2 97 % 07/01/19 0824  Vitals shown include unvalidated device data.  Last Pain:  Vitals:   07/01/19 0804  TempSrc:   PainSc: 0-No pain         Complications: No apparent anesthesia complications

## 2019-07-01 NOTE — Interval H&P Note (Signed)
History and Physical Interval Note:  07/01/2019 7:56 AM  Norville Haggard  has presented today for surgery, with the diagnosis of dysphagia, gerd.  The various methods of treatment have been discussed with the patient and family. After consideration of risks, benefits and other options for treatment, the patient has consented to  Procedure(s) with comments: ESOPHAGOGASTRODUODENOSCOPY (EGD) WITH PROPOFOL (N/A) - 2:45pm MALONEY DILATION (N/A) as a surgical intervention.  The patient's history has been reviewed, patient examined, no change in status, stable for surgery.  I have reviewed the patient's chart and labs.  Questions were answered to the patient's satisfaction.     Molly Maduro Heba Ige   No change from 3/22 office visit.  EGD with possible esophageal dilation as feasible appropriate today per plan.  The risks, benefits, limitations, alternatives and imponderables have been reviewed with the patient. Potential for esophageal dilation, biopsy, etc. have also been reviewed.  Questions have been answered. All parties agreeable.

## 2019-10-05 ENCOUNTER — Ambulatory Visit: Payer: BC Managed Care – PPO | Admitting: Nurse Practitioner

## 2019-10-28 ENCOUNTER — Ambulatory Visit: Payer: BC Managed Care – PPO | Admitting: Nurse Practitioner

## 2020-02-26 ENCOUNTER — Other Ambulatory Visit: Payer: Self-pay | Admitting: Nurse Practitioner

## 2020-02-26 DIAGNOSIS — R1319 Other dysphagia: Secondary | ICD-10-CM

## 2020-02-26 DIAGNOSIS — K219 Gastro-esophageal reflux disease without esophagitis: Secondary | ICD-10-CM

## 2020-06-21 ENCOUNTER — Emergency Department (HOSPITAL_COMMUNITY): Payer: Medicare PPO

## 2020-06-21 ENCOUNTER — Other Ambulatory Visit: Payer: Self-pay

## 2020-06-21 ENCOUNTER — Encounter (HOSPITAL_COMMUNITY): Payer: Self-pay

## 2020-06-21 ENCOUNTER — Emergency Department (HOSPITAL_COMMUNITY)
Admission: EM | Admit: 2020-06-21 | Discharge: 2020-06-21 | Disposition: A | Discharge status: Home / Self Care | Payer: Medicare PPO | Attending: Emergency Medicine | Admitting: Emergency Medicine

## 2020-06-21 DIAGNOSIS — Z79899 Other long term (current) drug therapy: Secondary | ICD-10-CM | POA: Diagnosis not present

## 2020-06-21 DIAGNOSIS — K219 Gastro-esophageal reflux disease without esophagitis: Secondary | ICD-10-CM | POA: Diagnosis not present

## 2020-06-21 DIAGNOSIS — R059 Cough, unspecified: Secondary | ICD-10-CM | POA: Insufficient documentation

## 2020-06-21 DIAGNOSIS — R1084 Generalized abdominal pain: Secondary | ICD-10-CM | POA: Diagnosis not present

## 2020-06-21 DIAGNOSIS — Z20822 Contact with and (suspected) exposure to covid-19: Secondary | ICD-10-CM | POA: Insufficient documentation

## 2020-06-21 DIAGNOSIS — R519 Headache, unspecified: Secondary | ICD-10-CM | POA: Insufficient documentation

## 2020-06-21 DIAGNOSIS — R112 Nausea with vomiting, unspecified: Secondary | ICD-10-CM | POA: Diagnosis not present

## 2020-06-21 DIAGNOSIS — I10 Essential (primary) hypertension: Secondary | ICD-10-CM | POA: Insufficient documentation

## 2020-06-21 DIAGNOSIS — R197 Diarrhea, unspecified: Secondary | ICD-10-CM | POA: Insufficient documentation

## 2020-06-21 DIAGNOSIS — R0602 Shortness of breath: Secondary | ICD-10-CM | POA: Insufficient documentation

## 2020-06-21 DIAGNOSIS — E876 Hypokalemia: Secondary | ICD-10-CM | POA: Insufficient documentation

## 2020-06-21 LAB — URINALYSIS, ROUTINE W REFLEX MICROSCOPIC
Bacteria, UA: NONE SEEN
Bilirubin Urine: NEGATIVE
Glucose, UA: NEGATIVE mg/dL
Ketones, ur: 20 mg/dL — AB
Leukocytes,Ua: NEGATIVE
Nitrite: NEGATIVE
Protein, ur: 30 mg/dL — AB
Specific Gravity, Urine: 1.021 (ref 1.005–1.030)
pH: 6 (ref 5.0–8.0)

## 2020-06-21 LAB — CBC
HCT: 49.8 % — ABNORMAL HIGH (ref 36.0–46.0)
Hemoglobin: 16.9 g/dL — ABNORMAL HIGH (ref 12.0–15.0)
MCH: 30.2 pg (ref 26.0–34.0)
MCHC: 33.9 g/dL (ref 30.0–36.0)
MCV: 88.9 fL (ref 80.0–100.0)
Platelets: 235 10*3/uL (ref 150–400)
RBC: 5.6 MIL/uL — ABNORMAL HIGH (ref 3.87–5.11)
RDW: 12.8 % (ref 11.5–15.5)
WBC: 5.8 10*3/uL (ref 4.0–10.5)
nRBC: 0 % (ref 0.0–0.2)

## 2020-06-21 LAB — RESP PANEL BY RT-PCR (FLU A&B, COVID) ARPGX2
Influenza A by PCR: NEGATIVE
Influenza B by PCR: NEGATIVE
SARS Coronavirus 2 by RT PCR: NEGATIVE

## 2020-06-21 LAB — BASIC METABOLIC PANEL
Anion gap: 12 (ref 5–15)
BUN: 14 mg/dL (ref 8–23)
CO2: 24 mmol/L (ref 22–32)
Calcium: 9.1 mg/dL (ref 8.9–10.3)
Chloride: 103 mmol/L (ref 98–111)
Creatinine, Ser: 0.73 mg/dL (ref 0.44–1.00)
GFR, Estimated: >60 mL/min (ref >60)
Glucose, Bld: 106 mg/dL — ABNORMAL HIGH (ref 70–99)
Potassium: 3.1 mmol/L — ABNORMAL LOW (ref 3.5–5.1)
Sodium: 139 mmol/L (ref 135–145)

## 2020-06-21 LAB — TROPONIN I (HIGH SENSITIVITY)
Troponin I (High Sensitivity): 3 ng/L (ref <18)
Troponin I (High Sensitivity): 3 ng/L (ref <18)

## 2020-06-21 LAB — D-DIMER, QUANTITATIVE: D-Dimer, Quant: 0.8 ug/mL-FEU — ABNORMAL HIGH (ref 0.00–0.50)

## 2020-06-21 MED ORDER — IOHEXOL 350 MG/ML SOLN
100.0000 mL | Freq: Once | INTRAVENOUS | Status: AC | PRN
  Administered 2020-06-21: 100 mL via INTRAVENOUS

## 2020-06-21 MED ORDER — ONDANSETRON 4 MG PO TBDP: 4.0000 mg | ORAL_TABLET | Freq: Three times a day (TID) | ORAL | 0 refills | Status: DC | PRN

## 2020-06-21 MED ORDER — SODIUM CHLORIDE 0.9 % IV BOLUS
1000.0000 mL | Freq: Once | INTRAVENOUS | Status: AC
  Administered 2020-06-21: 1000 mL via INTRAVENOUS

## 2020-06-21 MED ORDER — DICYCLOMINE HCL 20 MG PO TABS: 20.0000 mg | ORAL_TABLET | Freq: Two times a day (BID) | ORAL | 0 refills | Status: DC

## 2020-06-21 MED ORDER — ONDANSETRON HCL 4 MG/2ML IJ SOLN
4.0000 mg | Freq: Once | INTRAMUSCULAR | Status: AC
  Administered 2020-06-21: 4 mg via INTRAVENOUS
  Filled 2020-06-21: qty 2

## 2020-06-21 MED ORDER — LOPERAMIDE HCL 2 MG PO CAPS: 2.0000 mg | ORAL_CAPSULE | Freq: Four times a day (QID) | ORAL | 0 refills | Status: DC | PRN

## 2020-06-21 MED ORDER — MORPHINE SULFATE (PF) 4 MG/ML IV SOLN
4.0000 mg | Freq: Once | INTRAVENOUS | Status: AC
  Administered 2020-06-21: 4 mg via INTRAVENOUS
  Filled 2020-06-21: qty 1

## 2020-06-21 MED ORDER — POTASSIUM CHLORIDE 10 MEQ/100ML IV SOLN
10.0000 meq | INTRAVENOUS | Status: AC
  Administered 2020-06-21 (×2): 10 meq via INTRAVENOUS
  Filled 2020-06-21 (×2): qty 100

## 2020-06-21 NOTE — ED Triage Notes (Signed)
Pt to er, pt states that she is here for diarrhea for the past 5 days, states that over the past three days she has had a headache and feels generally weak, states that she has also had decreased PO intake.  States that she also had a little bit of vomiting, states that one of her co workers was dx with a gi bug.

## 2020-06-21 NOTE — ED Triage Notes (Signed)
Pt states that she was sitting in the waiting room and started having some chest tightness and sob,

## 2020-06-21 NOTE — ED Provider Notes (Signed)
[facility] EMERGENCY DEPARTMENT Provider Note   CSN: [id number] Arrival date & time: 06/21/20  1303     History Chief Complaint  Patient presents with  . Diarrhea    Courtney Marshall is a 65 y.o. female with past medical history significant for diverticulitis, anxiety, GERD, hypertension, hyperlipidemia who presents for evaluation of diarrhea.  Patient states she has had generalized lower abdominal cramping and diarrhea onset 5 days ago.  She is around son had similar complaints.  No recent antibiotics or travel.  Dates she is also had multiple episodes of NBNB emesis.  She has not been able to keep anything down.  Attempted Gatorade earlier today.  She has no focal abdominal pain however is described as generalized to her lower abdomen.  Pain does not radiate into her back.  She feels fatigued.  Patient does also note that she has a headache which she feels is related to dehydration.  She also has a mild cough shortness of breath.  States she did feel a mild episode of chest tightness while she was in the waiting room however this resolved.  She denies any unilateral leg swelling, redness or warmth.  No prior history of PE or DVT.  No recent surgery, mobilization, malignancy or clotting disorders. She has had a low-grade temperature at home.  Denies any vision changes, facial droop, paresthesias, weakness, dysuria, hematuria.  Denies additional aggravating or alleviating factors.  Last episode of diverticulitis in 2017.  States this does not feel similar.  History obtained from patient and past medical records.  No interpreter used.  HPI     Past Medical History:  Diagnosis Date  . Anxiety   . Arthritis   . Diverticulitis   . Elevated hemoglobin A1c   . GERD (gastroesophageal reflux disease)   . Hyperlipidemia   . Hypertension   . PMB (postmenopausal bleeding) 11/03/2014   Had bleeding last year, after trying HRT by [facility], will get [place]  . Trouble in sleeping 11/03/2014  .  Vaginal dryness 11/03/2014    Patient Active Problem List   Diagnosis Date Noted  . GERD (gastroesophageal reflux disease) 06/28/2019  . Dysphagia 06/28/2019  . Encounter for gynecological examination with Papanicolaou smear of cervix 04/22/2018  . Screening for colorectal cancer 04/22/2018  . Special screening for malignant neoplasms, colon   . Diverticulosis of large intestine without diverticulitis   . Family history of osteoporosis 10/15/2016  . Postmenopausal 10/15/2016  . PMB (postmenopausal bleeding) 11/03/2014  . Trouble in sleeping 11/03/2014  . Vaginal dryness 11/03/2014    Past Surgical History:  Procedure Laterality Date  . CESAREAN SECTION    . COLONOSCOPY N/A 12/16/2017   Procedure: COLONOSCOPY;  Surgeon: Franky Macho, MD;  Location: [facility] ENDO SUITE;  Service: Gastroenterology;  Laterality: N/A;  . ESOPHAGOGASTRODUODENOSCOPY (EGD) WITH PROPOFOL N/A 07/01/2019   Procedure: ESOPHAGOGASTRODUODENOSCOPY (EGD) WITH PROPOFOL;  Surgeon: Corbin Ade, MD;  Location: [facility] ENDO SUITE;  Service: Endoscopy;  Laterality: N/A;  2:45pm  . MALONEY DILATION N/A 07/01/2019   Procedure: Elease Hashimoto DILATION;  Surgeon: Corbin Ade, MD;  Location: [facility] ENDO SUITE;  Service: Endoscopy;  Laterality: N/A;  . TONSILLECTOMY    . TUBAL LIGATION       OB History    Gravida  3   Para  3   Term  3   Preterm      AB      Living  3     SAB  IAB      Ectopic      Multiple      Live Births  3           Family History  Problem Relation Age of Onset  . Osteoporosis Mother   . Heart disease Father   . Hypertension Father   . Hyperlipidemia Father   . Osteoporosis Sister   . Osteoporosis Maternal Grandmother   . Other Maternal Grandfather        hit by train  . Heart attack Paternal Grandfather   . Colon cancer Neg Hx     Social History   Tobacco Use  . Smoking status: Never Smoker  . Smokeless tobacco: Never Used  Vaping Use  . Vaping Use: Never used  Substance  Use Topics  . Alcohol use: No  . Drug use: No    Home Medications Prior to Admission medications   Medication Sig Start Date End Date Taking? Authorizing Provider  acetaminophen (TYLENOL) 500 MG tablet Take 1,000 mg by mouth every 6 (six) hours as needed.   Yes [provider]  buPROPion (WELLBUTRIN XL) 150 MG 24 hr tablet Take 150 mg by mouth daily. 04/12/20  Yes [provider]  Cholecalciferol (VITAMIN D3) 125 MCG (5000 UT) CAPS Take 1 capsule by mouth daily.   Yes [provider]  dicyclomine (BENTYL) 20 MG tablet Take 1 tablet (20 mg total) by mouth 2 (two) times daily. 06/21/20  Yes Henderly, Britni A, PA-C  escitalopram (LEXAPRO) 5 MG tablet Take 5 mg by mouth daily.   Yes [provider]  loperamide (IMODIUM) 2 MG capsule Take 1 capsule (2 mg total) by mouth 4 (four) times daily as needed for diarrhea or loose stools. 06/21/20  Yes Henderly, Britni A, PA-C  losartan-hydrochlorothiazide (HYZAAR) 100-25 MG tablet Take 1 tablet by mouth daily.    Yes [provider]  magnesium 30 MG tablet Take 30 mg by mouth 2 (two) times daily.   Yes [provider]  Multiple Vitamin (MULTIVITAMIN) tablet Take 1 tablet by mouth daily.   Yes [provider]  ondansetron (ZOFRAN ODT) 4 MG disintegrating tablet Take 1 tablet (4 mg total) by mouth every 8 (eight) hours as needed for nausea or vomiting. 06/21/20  Yes Henderly, Britni A, PA-C  pantoprazole (PROTONIX) 40 MG tablet TAKE (1) TABLET BY MOUTH TWICE DAILY. Patient taking differently: Take 40 mg by mouth 2 (two) times daily. 02/28/20  Yes Tiffany KocherLewis, Leslie S, PA-C  zinc gluconate 50 MG tablet Take 50 mg by mouth daily.   Yes [provider]  buPROPion (WELLBUTRIN XL) 300 MG 24 hr tablet Take 300 mg by mouth daily. Patient not taking: Reported on 06/21/2020    [provider]    Allergies    Lisinopril and Sulfa antibiotics  Review of Systems   Review of Systems   Constitutional: Positive for activity change, appetite change, chills and fatigue.  HENT: Negative.   Respiratory: Positive for cough, chest tightness and shortness of breath. Negative for apnea and choking.   Cardiovascular: Negative.   Gastrointestinal: Positive for abdominal pain, diarrhea, nausea and vomiting. Negative for abdominal distention, anal bleeding, blood in stool, constipation and rectal pain.  Genitourinary: Negative.   Musculoskeletal: Negative.   Neurological: Positive for weakness (Generalized) and headaches. Negative for dizziness, tremors, syncope, facial asymmetry, speech difficulty, light-headedness and numbness.  All other systems reviewed and are negative.   Physical Exam Updated Vital Signs BP (!) 118/94  Pulse 82   Temp 98.4 F (36.9 C) (Oral)   Resp 19   Ht  (1.651 m)   Wt 81.6 kg   SpO2 99%   BMI 29.95 kg/m   Physical Exam Vitals and nursing note reviewed.  Constitutional:      General: She is not in acute distress.    Appearance: She is well-developed. She is not ill-appearing, toxic-appearing or diaphoretic.  HENT:     Head: Normocephalic and atraumatic.     Nose: Nose normal.     Mouth/Throat:     Mouth: Mucous membranes are moist.  Eyes:     Pupils: Pupils are equal, round, and reactive to light.  Cardiovascular:     Rate and Rhythm: Normal rate.     Pulses: Normal pulses.          Dorsalis pedis pulses are 2+ on the right side and 2+ on the left side.     Heart sounds: Normal heart sounds.  Pulmonary:     Effort: Pulmonary effort is normal. No respiratory distress.     Breath sounds: Normal breath sounds.     Comments: Speaks in full sentences without difficulty.  Lungs clear to auscultation bilaterally Chest:     Comments: Equal rise and fall to chest wall Abdominal:     General: Bowel sounds are normal. There is no distension.     Palpations: Abdomen is soft.     Tenderness: There is generalized abdominal tenderness. There  is no right CVA tenderness, left CVA tenderness, guarding or rebound.     Hernia: No hernia is present.     Comments: Soft, mild generalized lower abdominal tenderness.  No focal tenderness.  No rebound or guarding.  Musculoskeletal:        General: Normal range of motion.     Cervical back: Normal range of motion.     Comments: Moves all 4 extremities without difficulty.  Compartments soft.  No bony tenderness.  Denna Haggard' sign negative  Skin:    General: Skin is warm and dry.     Capillary Refill: Capillary refill takes less than 2 seconds.     Comments: No edema, erythema or warmth.  No fluctuance or induration  Neurological:     General: No focal deficit present.     Mental Status: She is alert.     Cranial Nerves: Cranial nerves are intact.     Sensory: Sensation is intact.     Motor: Motor function is intact.     Gait: Gait is intact.     Comments: Cranial nerves II 12 grossly intact Equal handgrip Ambulatory with out difficulty     ED Results / Procedures / Treatments   Labs (all labs ordered are listed, but only abnormal results are displayed) Labs Reviewed  BASIC METABOLIC PANEL - Abnormal; Notable for the following components:      Result Value   Potassium 3.1 (*)    Glucose, Bld 106 (*)    All other components within normal limits  CBC - Abnormal; Notable for the following components:   RBC 5.60 (*)    Hemoglobin 16.9 (*)    HCT 49.8 (*)    All other components within normal limits  D-DIMER, QUANTITATIVE - Abnormal; Notable for the following components:   D-Dimer, Quant 0.80 (*)    All other components within normal limits  URINALYSIS, ROUTINE W REFLEX MICROSCOPIC - Abnormal; Notable for the following components:   Hgb urine dipstick SMALL (*)  Ketones, ur 20 (*)    Protein, ur 30 (*)    All other components within normal limits  RESP PANEL BY RT-PCR (FLU A&B, COVID) ARPGX2  TROPONIN I (HIGH SENSITIVITY)  TROPONIN I (HIGH SENSITIVITY)     EKG None  Radiology DG Chest 2 View  Result Date: 06/21/2020 CLINICAL DATA:  Chest pain. EXAM: CHEST - 2 VIEW COMPARISON:  None. FINDINGS: The heart size and mediastinal contours are within normal limits. No consolidation. Linear scar/atelectasis in the left midlung. No visible pleural effusions or pneumothorax. No acute osseous abnormality. IMPRESSION: 1. No evidence of acute cardiopulmonary disease. 2. Left midlung scar/atelectasis. Electronically Signed   By: Feliberto Harts MD   On: 06/21/2020 15:22   CT Angio Chest PE W/Cm &/Or Wo Cm  Result Date: 06/21/2020 CLINICAL DATA:  Chest pain and short of breath, tachycardia, positive D-dimer, vomiting and diarrhea for 4 days EXAM: CT ANGIOGRAPHY CHEST CT ABDOMEN AND PELVIS WITH CONTRAST TECHNIQUE: Multidetector CT imaging of the chest was performed using the standard protocol during bolus administration of intravenous contrast. Multiplanar CT image reconstructions and MIPs were obtained to evaluate the vascular anatomy. Multidetector CT imaging of the abdomen and pelvis was performed using the standard protocol during bolus administration of intravenous contrast. CONTRAST:  OMNIPAQUE IOHEXOL 350 MG/ML SOLN COMPARISON:  06/21/2020 FINDINGS: CTA CHEST FINDINGS Cardiovascular: This is a technically adequate evaluation of the pulmonary vasculature. No filling defects or pulmonary emboli. The heart is unremarkable without significant pericardial effusion. No evidence of thoracic aortic aneurysm or dissection. Mild atherosclerosis of the aortic arch and coronary vessels. Mediastinum/Nodes: No enlarged mediastinal, hilar, or axillary lymph nodes. Thyroid gland, trachea, and esophagus demonstrate no significant findings. Lungs/Pleura: No acute airspace disease, effusion, or pneumothorax. The central airways are patent. Musculoskeletal: No acute or destructive bony lesions. Reconstructed images demonstrate no additional findings. Review of the MIP  images confirms the above findings. CT ABDOMEN and PELVIS FINDINGS Hepatobiliary: No focal liver abnormality is seen. No gallstones, gallbladder wall thickening, or biliary dilatation. Pancreas: Unremarkable. No pancreatic ductal dilatation or surrounding inflammatory changes. Spleen: Normal in size without focal abnormality. Adrenals/Urinary Tract: No urinary tract calculi or obstructive uropathy. Small cyst lower pole right kidney. Otherwise the kidneys enhance normally and symmetrically. The adrenals and bladder are grossly unremarkable. Stomach/Bowel: No bowel obstruction or ileus. There is diffuse diverticulosis of the descending and sigmoid colon without evidence of diverticulitis. Normal appendix right lower quadrant. No bowel wall thickening or inflammatory change. Vascular/Lymphatic: Minimal atherosclerosis at the bifurcation of the right common iliac artery. No other significant vascular findings. No pathologic adenopathy within the abdomen or pelvis. Reproductive: Uterus and bilateral adnexa are unremarkable. Other: No free fluid or free gas. Fat containing bili cul hernia without evidence of incarceration. No bowel herniation. Musculoskeletal: No acute or destructive bony lesions. Reconstructed images demonstrate no additional findings. Review of the MIP images confirms the above findings. IMPRESSION: 1. No evidence of pulmonary embolus. 2. No acute intrathoracic process. 3. Distal colonic diverticulosis without diverticulitis. 4. Fat containing umbilical hernia.  No bowel herniation. 5. No acute intra-abdominal or intrapelvic process. 6.  Aortic Atherosclerosis (ICD10-I70.0). Electronically Signed   By: Sharlet Salina M.D.   On: 06/21/2020 19:17   CT Abdomen Pelvis W Contrast  Result Date: 06/21/2020 CLINICAL DATA:  Chest pain and short of breath, tachycardia, positive D-dimer, vomiting and diarrhea for 4 days EXAM: CT ANGIOGRAPHY CHEST CT ABDOMEN AND PELVIS WITH CONTRAST TECHNIQUE: Multidetector CT  imaging of the chest was  performed using the standard protocol during bolus administration of intravenous contrast. Multiplanar CT image reconstructions and MIPs were obtained to evaluate the vascular anatomy. Multidetector CT imaging of the abdomen and pelvis was performed using the standard protocol during bolus administration of intravenous contrast. CONTRAST:  OMNIPAQUE IOHEXOL 350 MG/ML SOLN COMPARISON:  06/21/2020 FINDINGS: CTA CHEST FINDINGS Cardiovascular: This is a technically adequate evaluation of the pulmonary vasculature. No filling defects or pulmonary emboli. The heart is unremarkable without significant pericardial effusion. No evidence of thoracic aortic aneurysm or dissection. Mild atherosclerosis of the aortic arch and coronary vessels. Mediastinum/Nodes: No enlarged mediastinal, hilar, or axillary lymph nodes. Thyroid gland, trachea, and esophagus demonstrate no significant findings. Lungs/Pleura: No acute airspace disease, effusion, or pneumothorax. The central airways are patent. Musculoskeletal: No acute or destructive bony lesions. Reconstructed images demonstrate no additional findings. Review of the MIP images confirms the above findings. CT ABDOMEN and PELVIS FINDINGS Hepatobiliary: No focal liver abnormality is seen. No gallstones, gallbladder wall thickening, or biliary dilatation. Pancreas: Unremarkable. No pancreatic ductal dilatation or surrounding inflammatory changes. Spleen: Normal in size without focal abnormality. Adrenals/Urinary Tract: No urinary tract calculi or obstructive uropathy. Small cyst lower pole right kidney. Otherwise the kidneys enhance normally and symmetrically. The adrenals and bladder are grossly unremarkable. Stomach/Bowel: No bowel obstruction or ileus. There is diffuse diverticulosis of the descending and sigmoid colon without evidence of diverticulitis. Normal appendix right lower quadrant. No bowel wall thickening or inflammatory change.  Vascular/Lymphatic: Minimal atherosclerosis at the bifurcation of the right common iliac artery. No other significant vascular findings. No pathologic adenopathy within the abdomen or pelvis. Reproductive: Uterus and bilateral adnexa are unremarkable. Other: No free fluid or free gas. Fat containing bili cul hernia without evidence of incarceration. No bowel herniation. Musculoskeletal: No acute or destructive bony lesions. Reconstructed images demonstrate no additional findings. Review of the MIP images confirms the above findings. IMPRESSION: 1. No evidence of pulmonary embolus. 2. No acute intrathoracic process. 3. Distal colonic diverticulosis without diverticulitis. 4. Fat containing umbilical hernia.  No bowel herniation. 5. No acute intra-abdominal or intrapelvic process. 6.  Aortic Atherosclerosis (ICD10-I70.0). Electronically Signed   By: Sharlet Salina M.D.   On: 06/21/2020 19:17    Procedures Procedures   Medications Ordered in ED Medications  sodium chloride 0.9 % bolus 1,000 mL (0 mLs Intravenous Stopped 06/21/20 1809)  ondansetron (ZOFRAN) injection 4 mg (4 mg Intravenous Given 06/21/20 1649)  morphine 4 MG/ML injection 4 mg (4 mg Intravenous Given 06/21/20 1649)  potassium chloride 10 mEq in 100 mL IVPB (0 mEq Intravenous Stopped 06/21/20 1910)  iohexol (OMNIPAQUE) 350 MG/ML injection 100 mL (100 mLs Intravenous Contrast Given 06/21/20 1847)    ED Course  I have reviewed the triage vital signs and the nursing notes.  Pertinent labs & imaging results that were available during my care of the patient were reviewed by me and considered in my medical decision making (see chart for details).  65 year old here for evaluation of diarrhea.  No recent antibiotics or travel.  She is afebrile, nonseptic, not ill-appearing.  Her heart and lungs are clear.  Abdomen does have some mild diffuse tenderness to her lower abdomen.  She does note that she had an episode of chest tightness and shortness of  breath while in the waiting room.  No current pain.  Has had a mild cough without hemoptysis.  No clinical evidence of DVT on exam.  Triage vital signs do no tachycardia.  Denies any  prior history of PE, DVT, recent surgery, mobilization or malignancy.  We will add on D-dimer to labs obtained from triage.  Will also obtain Covid test.  We will plan on labs, imaging and reassess  Labs and imaging personally reviewed and interpreted:  CBC without leukocytosis, Hgb 16.9 likely due to hemoconcentration Metabolic panel significant for hypokalemia at 3.1, glucose 106, no additional electrolyte, renal or liver normality, will add in potassium supplementation Troponin 3>>3 D-dimer 0.80 UA negative for infection Covid Negative DG chest without evidence of infiltrates, cardiomegaly or pulmonary edema. EKG without ischemic changes  Patient reassessed.  Pain improved. None currently. No emesis since Zofran.  CTA Chest no acute findings CT [facility] without acute findings  Patient reassessed.  Discussed labs and imaging.  Question viral gastroenteritis.  Unclear etiology of her earlier chest pain however patient states this only lasted a few seconds, no diaphoresis, numbness or tingling, radiation.  She had negative delta troponin.  Heart score 3.  Low suspicion for acute ACS, PE, dissection.  Patient is nontoxic, nonseptic appearing, in no apparent distress.  Patient's pain and other symptoms adequately managed in emergency department.  Fluid bolus given.  Labs, imaging and vitals reviewed.  Patient does not meet the SIRS or Sepsis criteria.  On repeat exam patient does not have a surgical abdomin and there are no peritoneal signs.  No indication of appendicitis, bowel obstruction, bowel perforation, cholecystitis, diverticulitis, PID.  Chest pain is not likely of cardiac or pulmonary etiology d/t presentation, PERC negative, VSS, no tracheal deviation, no JVD or new murmur, RRR, breath sounds equal bilaterally,  EKG without acute abnormalities, negative troponin, and negative CXR. Pt has been advised to return to the ED if CP becomes exertional, associated with diaphoresis or nausea, radiates to left jaw/arm, worsens or becomes concerning in any way. Pt appears reliable for follow up and is agreeable to discharge.   Reevaluation her abdominal exam is benign.  She has been able to tolerate p.o. intake without difficulty.  Will DC home with symptomatic management.  The patient has been appropriately medically screened and/or stabilized in the ED. I have low suspicion for any other emergent medical condition which would require further screening, evaluation or treatment in the ED or require inpatient management.  Patient is hemodynamically stable and in no acute distress.  Patient able to ambulate in department prior to ED.  Evaluation does not show acute pathology that would require ongoing or additional emergent interventions while in the emergency department or further inpatient treatment.  I have discussed the diagnosis with the patient and answered all questions.  Pain is been managed while in the emergency department and patient has no further complaints prior to discharge.  Patient is comfortable with plan discussed in room and is stable for discharge at this time.  I have discussed strict return precautions for returning to the emergency department.  Patient was encouraged to follow-up with PCP/specialist refer to at discharge.    MDM Rules/Calculators/A&P                           Final Clinical Impression(s) / ED Diagnoses Final diagnoses:  Nausea vomiting and diarrhea  Hypokalemia  Generalized abdominal pain    Rx / DC Orders ED Discharge Orders         Ordered    dicyclomine (BENTYL) 20 MG tablet  2 times daily        06/21/20 2005    ondansetron (  ZOFRAN ODT) 4 MG disintegrating tablet  Every 8 hours PRN        06/21/20 2005    loperamide (IMODIUM) 2 MG capsule  4 times daily PRN         06/21/20 2005           Henderly, Britni A, PA-C 06/21/20 2011    Vanetta Mulders, MD 06/28/20 [phone]

## 2020-06-21 NOTE — Discharge Instructions (Signed)
It was our pleasure taking care of you here in the emergency department today  Your work-up was reassuring.  You did have a mildly low potassium which was likely from your diarrhea.  We have supplemented this with IV.  Your CT scan of your chest did not show any evidence of blood clots, pneumonia.  Your CT scan of your abdomen did not show any evidence of acute bacterial infectious process.  Your symptoms are likely related to a viral gastroenteritis.  I have written you for 3 medications to help.  You may use the Imodium for diarrhea.  Take 2 tablets at first onset of diarrhea.  Make an additional tablet for subsequent episodes.  Do not take more than 5 tablets in a 24-hour period.  You may use the Zofran as needed for nausea or vomiting.  This is a disintegrating tablet you sick under your tongue.  The Bentyl is for abdominal spasms.  May take as needed for pain.  If you develop persistent vomiting despite the Zofran, bloody diarrhea, increased pain please seek reevaluation the emergency department otherwise any follow-up primary care provider within the next week for reevaluation.

## 2020-10-27 DIAGNOSIS — E559 Vitamin D deficiency, unspecified: Secondary | ICD-10-CM | POA: Diagnosis not present

## 2020-10-27 DIAGNOSIS — Z6831 Body mass index (BMI) 31.0-31.9, adult: Secondary | ICD-10-CM | POA: Diagnosis not present

## 2020-10-27 DIAGNOSIS — E039 Hypothyroidism, unspecified: Secondary | ICD-10-CM | POA: Diagnosis not present

## 2020-10-27 DIAGNOSIS — Z0001 Encounter for general adult medical examination with abnormal findings: Secondary | ICD-10-CM | POA: Diagnosis not present

## 2020-10-27 DIAGNOSIS — E6609 Other obesity due to excess calories: Secondary | ICD-10-CM | POA: Diagnosis not present

## 2020-10-27 DIAGNOSIS — R7309 Other abnormal glucose: Secondary | ICD-10-CM | POA: Diagnosis not present

## 2020-10-27 DIAGNOSIS — I10 Essential (primary) hypertension: Secondary | ICD-10-CM | POA: Diagnosis not present

## 2020-10-27 DIAGNOSIS — K219 Gastro-esophageal reflux disease without esophagitis: Secondary | ICD-10-CM | POA: Diagnosis not present

## 2020-10-27 DIAGNOSIS — E782 Mixed hyperlipidemia: Secondary | ICD-10-CM | POA: Diagnosis not present

## 2020-10-27 DIAGNOSIS — Z23 Encounter for immunization: Secondary | ICD-10-CM | POA: Diagnosis not present

## 2020-10-27 DIAGNOSIS — Z1389 Encounter for screening for other disorder: Secondary | ICD-10-CM | POA: Diagnosis not present

## 2020-10-31 ENCOUNTER — Other Ambulatory Visit (HOSPITAL_COMMUNITY): Payer: Self-pay | Admitting: Family Medicine

## 2020-10-31 DIAGNOSIS — E2839 Other primary ovarian failure: Secondary | ICD-10-CM

## 2020-10-31 DIAGNOSIS — Z1231 Encounter for screening mammogram for malignant neoplasm of breast: Secondary | ICD-10-CM

## 2020-11-20 ENCOUNTER — Other Ambulatory Visit (HOSPITAL_COMMUNITY): Payer: Medicare PPO

## 2020-11-20 ENCOUNTER — Ambulatory Visit (HOSPITAL_COMMUNITY): Payer: Medicare PPO

## 2020-11-27 ENCOUNTER — Ambulatory Visit (HOSPITAL_COMMUNITY)
Admission: RE | Admit: 2020-11-27 | Discharge: 2020-11-27 | Disposition: A | Discharge status: Home / Self Care | Payer: Medicare PPO | Source: Ambulatory Visit | Attending: Family Medicine | Admitting: Family Medicine

## 2020-11-27 ENCOUNTER — Other Ambulatory Visit: Payer: Self-pay

## 2020-11-27 DIAGNOSIS — Z13820 Encounter for screening for osteoporosis: Secondary | ICD-10-CM | POA: Diagnosis not present

## 2020-11-27 DIAGNOSIS — Z1231 Encounter for screening mammogram for malignant neoplasm of breast: Secondary | ICD-10-CM | POA: Insufficient documentation

## 2020-11-27 DIAGNOSIS — M8589 Other specified disorders of bone density and structure, multiple sites: Secondary | ICD-10-CM | POA: Diagnosis not present

## 2020-11-27 DIAGNOSIS — E2839 Other primary ovarian failure: Secondary | ICD-10-CM

## 2020-11-27 DIAGNOSIS — M85832 Other specified disorders of bone density and structure, left forearm: Secondary | ICD-10-CM | POA: Diagnosis not present

## 2020-11-27 DIAGNOSIS — Z780 Asymptomatic menopausal state: Secondary | ICD-10-CM | POA: Diagnosis not present

## 2020-11-27 DIAGNOSIS — M85852 Other specified disorders of bone density and structure, left thigh: Secondary | ICD-10-CM | POA: Diagnosis not present

## 2020-12-18 DIAGNOSIS — D3611 Benign neoplasm of peripheral nerves and autonomic nervous system of face, head, and neck: Secondary | ICD-10-CM | POA: Diagnosis not present

## 2020-12-18 DIAGNOSIS — L570 Actinic keratosis: Secondary | ICD-10-CM | POA: Diagnosis not present

## 2020-12-18 DIAGNOSIS — L814 Other melanin hyperpigmentation: Secondary | ICD-10-CM | POA: Diagnosis not present

## 2020-12-18 DIAGNOSIS — D485 Neoplasm of uncertain behavior of skin: Secondary | ICD-10-CM | POA: Diagnosis not present

## 2020-12-18 DIAGNOSIS — Z85828 Personal history of other malignant neoplasm of skin: Secondary | ICD-10-CM | POA: Diagnosis not present

## 2020-12-25 ENCOUNTER — Other Ambulatory Visit: Payer: Self-pay

## 2020-12-25 ENCOUNTER — Ambulatory Visit: Payer: Medicare PPO | Admitting: Orthopedic Surgery

## 2020-12-25 ENCOUNTER — Encounter: Payer: Self-pay | Admitting: Orthopedic Surgery

## 2020-12-25 ENCOUNTER — Ambulatory Visit: Payer: Medicare PPO

## 2020-12-25 VITALS — BP 140/78 | HR 81 | Ht 65.0 in | Wt 181.2 lb

## 2020-12-25 DIAGNOSIS — M170 Bilateral primary osteoarthritis of knee: Secondary | ICD-10-CM | POA: Insufficient documentation

## 2020-12-25 DIAGNOSIS — M25561 Pain in right knee: Secondary | ICD-10-CM

## 2020-12-25 DIAGNOSIS — M25562 Pain in left knee: Secondary | ICD-10-CM

## 2020-12-25 NOTE — Progress Notes (Signed)
Chief Complaint  Patient presents with   Knee Pain    Bilateral//per patient L>R now//but feels better since she has made the appt   History 65 year old female with who presents with 75-month history of bilateral knee pain which today happens to be normal she is been on meloxicam for 6 weeks and thinks that that is helping somewhat  Certain activities seem to bother the knee especially treadmill and elliptical she also has some mild pain with regular walking does not have discomfort when on an exercise rowing machine  Her past medical history, surgical and social history have been reviewed  BP 140/78   Pulse 81   Ht 5\' 5"  (1.651 m)   Wt 181 lb 3.2 oz (82.2 kg)   BMI 30.15 kg/m   She is awake alert and oriented x3  Mood and affect is normal  Left and right knee tender medial joint line skin normal range of motion normal ligaments normal muscle tone normal  X-rays show medial compartment narrowing mild varus The medial compartment narrowing is extensive Mild peripheral osteophytes are noted  Encounter Diagnoses  Name Primary?   Left knee pain, unspecified chronicity Yes   Right knee pain, unspecified chronicity    Primary osteoarthritis of both knees    At this point I would just recommend continuing the meloxicam activity modification and exercises that are not painful and follow-up with as needed

## 2021-02-27 DIAGNOSIS — J209 Acute bronchitis, unspecified: Secondary | ICD-10-CM | POA: Diagnosis not present

## 2021-02-27 DIAGNOSIS — Z20822 Contact with and (suspected) exposure to covid-19: Secondary | ICD-10-CM | POA: Diagnosis not present

## 2021-02-27 DIAGNOSIS — R52 Pain, unspecified: Secondary | ICD-10-CM | POA: Diagnosis not present

## 2021-02-27 DIAGNOSIS — Z03818 Encounter for observation for suspected exposure to other biological agents ruled out: Secondary | ICD-10-CM | POA: Diagnosis not present

## 2021-05-01 DIAGNOSIS — Z6829 Body mass index (BMI) 29.0-29.9, adult: Secondary | ICD-10-CM | POA: Diagnosis not present

## 2021-05-01 DIAGNOSIS — E782 Mixed hyperlipidemia: Secondary | ICD-10-CM | POA: Diagnosis not present

## 2021-05-01 DIAGNOSIS — J302 Other seasonal allergic rhinitis: Secondary | ICD-10-CM | POA: Diagnosis not present

## 2021-05-01 DIAGNOSIS — E039 Hypothyroidism, unspecified: Secondary | ICD-10-CM | POA: Diagnosis not present

## 2021-05-01 DIAGNOSIS — I10 Essential (primary) hypertension: Secondary | ICD-10-CM | POA: Diagnosis not present

## 2021-05-01 DIAGNOSIS — F329 Major depressive disorder, single episode, unspecified: Secondary | ICD-10-CM | POA: Diagnosis not present

## 2021-05-01 DIAGNOSIS — F419 Anxiety disorder, unspecified: Secondary | ICD-10-CM | POA: Diagnosis not present

## 2021-05-01 DIAGNOSIS — E6609 Other obesity due to excess calories: Secondary | ICD-10-CM | POA: Diagnosis not present

## 2021-05-01 DIAGNOSIS — M1711 Unilateral primary osteoarthritis, right knee: Secondary | ICD-10-CM | POA: Diagnosis not present

## 2021-06-18 DIAGNOSIS — D1801 Hemangioma of skin and subcutaneous tissue: Secondary | ICD-10-CM | POA: Diagnosis not present

## 2021-06-18 DIAGNOSIS — L438 Other lichen planus: Secondary | ICD-10-CM | POA: Diagnosis not present

## 2021-06-18 DIAGNOSIS — L821 Other seborrheic keratosis: Secondary | ICD-10-CM | POA: Diagnosis not present

## 2021-06-18 DIAGNOSIS — Z85828 Personal history of other malignant neoplasm of skin: Secondary | ICD-10-CM | POA: Diagnosis not present

## 2021-06-18 DIAGNOSIS — L570 Actinic keratosis: Secondary | ICD-10-CM | POA: Diagnosis not present

## 2021-07-10 DIAGNOSIS — Z6829 Body mass index (BMI) 29.0-29.9, adult: Secondary | ICD-10-CM | POA: Diagnosis not present

## 2021-07-10 DIAGNOSIS — J22 Unspecified acute lower respiratory infection: Secondary | ICD-10-CM | POA: Diagnosis not present

## 2021-09-21 ENCOUNTER — Encounter: Payer: Self-pay | Admitting: Emergency Medicine

## 2021-09-21 ENCOUNTER — Ambulatory Visit
Admission: EM | Admit: 2021-09-21 | Discharge: 2021-09-21 | Disposition: A | Discharge status: Home / Self Care | Payer: Medicare PPO | Attending: Nurse Practitioner | Admitting: Nurse Practitioner

## 2021-09-21 DIAGNOSIS — S0101XA Laceration without foreign body of scalp, initial encounter: Secondary | ICD-10-CM

## 2021-09-21 DIAGNOSIS — Z23 Encounter for immunization: Secondary | ICD-10-CM | POA: Diagnosis not present

## 2021-09-21 MED ORDER — TETANUS-DIPHTH-ACELL PERTUSSIS 5-2.5-18.5 LF-MCG/0.5 IM SUSY
0.5000 mL | PREFILLED_SYRINGE | Freq: Once | INTRAMUSCULAR | Status: AC
  Administered 2021-09-21: 0.5 mL via INTRAMUSCULAR

## 2021-09-21 MED ORDER — LIDOCAINE-EPINEPHRINE-TETRACAINE (LET) TOPICAL GEL
3.0000 mL | Freq: Once | TOPICAL | Status: AC
  Administered 2021-09-21: 3 mL via TOPICAL

## 2021-09-21 NOTE — ED Provider Notes (Signed)
[facility]    CSN: [id number] Arrival date & time: 09/21/21  1306      History   Chief Complaint No chief complaint on file.   HPI Courtney Marshall is a 66 y.o. female.   Patient presents today for laceration that she sustained earlier today.  Reports she was putting groceries in her trunk when her grandson attempted to close the trunk and accidentally hit her head.  She denies vision changes, loss of consciousness, vomiting, or nausea since the incident.  She does have a severe headache.  She reports the bleeding is controlled at this time.  No history of head injury, she does not take blood thinners.    Past Medical History:  Diagnosis Date   Anxiety    Arthritis    Diverticulitis    Elevated hemoglobin A1c    GERD (gastroesophageal reflux disease)    Hyperlipidemia    Hypertension    PMB (postmenopausal bleeding) 11/03/2014   Had bleeding last year, after trying HRT by [facility], will get [place]   Trouble in sleeping 11/03/2014   Vaginal dryness 11/03/2014    Patient Active Problem List   Diagnosis Date Noted   Primary osteoarthritis of both knees 12/25/2020   GERD (gastroesophageal reflux disease) 06/28/2019   Dysphagia 06/28/2019   Encounter for gynecological examination with Papanicolaou smear of cervix 04/22/2018   Screening for colorectal cancer 04/22/2018   Special screening for malignant neoplasms, colon    Diverticulosis of large intestine without diverticulitis    Family history of osteoporosis 10/15/2016   Postmenopausal 10/15/2016   PMB (postmenopausal bleeding) 11/03/2014   Trouble in sleeping 11/03/2014   Vaginal dryness 11/03/2014    Past Surgical History:  Procedure Laterality Date   CESAREAN SECTION     COLONOSCOPY N/A 12/16/2017   Procedure: COLONOSCOPY;  Surgeon: Franky Macho, MD;  [facility];  Service: Gastroenterology;  Laterality: N/A;   ESOPHAGOGASTRODUODENOSCOPY (EGD) WITH PROPOFOL N/A 07/01/2019    Procedure: ESOPHAGOGASTRODUODENOSCOPY (EGD) WITH PROPOFOL;  Surgeon: Corbin Ade, MD;  [facility];  Service: Endoscopy;  Laterality: N/A;  2:45pm   MALONEY DILATION N/A 07/01/2019   Procedure: Elease Hashimoto DILATION;  Surgeon: Corbin Ade, MD;  [facility];  Service: Endoscopy;  Laterality: N/A;   TONSILLECTOMY     TUBAL LIGATION      OB History     Gravida  3   Para  3   Term  3   Preterm      AB      Living  3      SAB      IAB      Ectopic      Multiple      Live Births  3            Home Medications    Prior to Admission medications   Medication Sig Start Date End Date Taking? Authorizing Provider  acetaminophen (TYLENOL) 500 MG tablet Take 1,000 mg by mouth every 6 (six) hours as needed.    [provider]  buPROPion (WELLBUTRIN XL) 150 MG 24 hr tablet Take 150 mg by mouth daily. 04/12/20   [provider]  calcium citrate-vitamin D (CITRACAL+D) 315-200 MG-UNIT tablet Take 1 tablet by mouth daily.    [provider]  Cholecalciferol (VITAMIN D3) 125 MCG (5000 UT) CAPS Take 1 capsule by mouth daily.    [provider]  escitalopram (LEXAPRO) 5 MG tablet Take 5  mg by mouth daily.    [provider]  losartan-hydrochlorothiazide (HYZAAR) 100-25 MG tablet Take 1 tablet by mouth daily.     [provider]  magnesium 30 MG tablet Take 30 mg by mouth 2 (two) times daily.    [provider]  meloxicam (MOBIC) 15 MG tablet Take 15 mg by mouth daily as needed. 12/22/20   [provider]  ondansetron (ZOFRAN ODT) 4 MG disintegrating tablet Take 1 tablet (4 mg total) by mouth every 8 (eight) hours as needed for nausea or vomiting. 06/21/20   Henderly, Britni A, PA-C  pantoprazole (PROTONIX) 40 MG tablet TAKE (1) TABLET BY MOUTH TWICE DAILY. Patient taking differently: Take 40 mg by mouth 2 (two) times daily. 02/28/20   Tiffany Kocher, PA-C    Family History Family History   Problem Relation Age of Onset   Osteoporosis Mother    Heart disease Father    Hypertension Father    Hyperlipidemia Father    Osteoporosis Sister    Osteoporosis Maternal Grandmother    Other Maternal Grandfather        hit by train   Heart attack Paternal Grandfather    Colon cancer Neg Hx     Social History Social History   Tobacco Use   Smoking status: Never   Smokeless tobacco: Never  Vaping Use   Vaping Use: Never used  Substance Use Topics   Alcohol use: No   Drug use: No     Allergies   Lisinopril and Sulfa antibiotics   Review of Systems Review of Systems Per HPI  Physical Exam Triage Vital Signs ED Triage Vitals  Enc Vitals Group     BP 09/21/21 1350 (!) 156/84     Pulse Rate 09/21/21 1350 87     Resp 09/21/21 1350 18     Temp 09/21/21 1350 98.5 F (36.9 C)     Temp Source 09/21/21 1350 Oral     SpO2 09/21/21 1350 95 %     Weight --      Height --      Head Circumference --      Peak Flow --      Pain Score 09/21/21 1351 7     Pain Loc --      Pain Edu? --      Excl. in GC? --    No data found.  Updated Vital Signs BP (!) 156/84 (BP Location: Right Arm)   Pulse 87   Temp 98.5 F (36.9 C) (Oral)   Resp 18   SpO2 95%   Visual Acuity Right Eye Distance:   Left Eye Distance:   Bilateral Distance:    Right Eye Near:   Left Eye Near:    Bilateral Near:     Physical Exam Vitals and nursing note reviewed.  Constitutional:      General: She is not in acute distress.    Appearance: Normal appearance. She is not toxic-appearing.  HENT:     Head:   Eyes:     General: No scleral icterus.    Extraocular Movements: Extraocular movements intact.     Pupils: Pupils are equal, round, and reactive to light.  Pulmonary:     Effort: Pulmonary effort is normal. No respiratory distress.  Skin:    General: Skin is warm and dry.     Capillary Refill: Capillary refill takes less than 2 seconds.     Coloration: Skin is not jaundiced or pale.  Findings: Laceration present. No erythema.     Comments: Approximately 2 cm laceration to right scalp in area marked  Neurological:     Mental Status: She is alert and oriented to person, place, and time.  Psychiatric:        Behavior: Behavior is cooperative.      UC Treatments / Results  Labs (all labs ordered are listed, but only abnormal results are displayed) Labs Reviewed - No data to display  EKG   Radiology No results found.  Procedures Laceration Repair  Date/Time: 09/21/2021 4:12 PM  Performed by: Valentino Nose, NP Authorized by: Valentino Nose, NP   Consent:    Consent obtained:  Verbal   Consent given by:  Patient   Risks, benefits, and alternatives were discussed: yes     Risks discussed:  Infection, pain and poor cosmetic result   Alternatives discussed:  No treatment Universal protocol:    Procedure explained and questions answered to patient or proxy's satisfaction: yes     Patient identity confirmed:  Verbally with patient Anesthesia:    Anesthesia method:  Topical application   Topical anesthetic:  LET Laceration details:    Location:  Scalp   Scalp location:  R parietal   Length (cm):  2 Treatment:    Area cleansed with:  Saline   Amount of cleaning:  Standard Skin repair:    Repair method:  Staples   Number of staples:  2 Approximation:    Approximation:  Close Repair type:    Repair type:  Simple Post-procedure details:    Dressing:  Open (no dressing)   Procedure completion:  Tolerated well, no immediate complications  (including critical care time)  Medications Ordered in UC Medications  lidocaine-EPINEPHrine-tetracaine (LET) topical gel (3 mLs Topical Given 09/21/21 1416)  Tdap (BOOSTRIX) injection 0.5 mL (0.5 mLs Intramuscular Given 09/21/21 1455)    Initial Impression / Assessment and Plan / UC Course  I have reviewed the triage vital signs and the nursing notes.  Pertinent labs & imaging results that were  available during my care of the patient were reviewed by me and considered in my medical decision making (see chart for details).    Scalp laceration closed with staples as above.  Wound care discussed.  Return in 7 to 10 days for staple removal.  Tdap updated. Final Clinical Impressions(s) / UC Diagnoses   Final diagnoses:  Laceration of scalp, initial encounter     Discharge Instructions      - We closed the laceration on your head with 2 staples today; please come back in 7 to 10 days to have the staples taken out -We have updated your tetanus shot today -You can use ibuprofen and/or Tylenol as needed for headache    ED Prescriptions   None    PDMP not reviewed this encounter.   Valentino Nose, NP 09/21/21 [phone]

## 2021-09-21 NOTE — Discharge Instructions (Addendum)
-   We closed the laceration on your head with 2 staples today; please come back in 7 to 10 days to have the staples taken out -We have updated your tetanus shot today -You can use ibuprofen and/or Tylenol as needed for headache

## 2021-09-21 NOTE — ED Triage Notes (Signed)
Hit head on hatch of car today.

## 2021-09-27 DIAGNOSIS — M25561 Pain in right knee: Secondary | ICD-10-CM | POA: Diagnosis not present

## 2021-09-27 DIAGNOSIS — M25562 Pain in left knee: Secondary | ICD-10-CM | POA: Diagnosis not present

## 2021-09-27 DIAGNOSIS — M170 Bilateral primary osteoarthritis of knee: Secondary | ICD-10-CM | POA: Diagnosis not present

## 2021-09-30 ENCOUNTER — Ambulatory Visit
Admission: EM | Admit: 2021-09-30 | Discharge: 2021-09-30 | Disposition: A | Discharge status: Home / Self Care | Payer: Medicare PPO | Attending: Nurse Practitioner | Admitting: Nurse Practitioner

## 2021-09-30 DIAGNOSIS — S0501XA Injury of conjunctiva and corneal abrasion without foreign body, right eye, initial encounter: Secondary | ICD-10-CM | POA: Diagnosis not present

## 2021-09-30 MED ORDER — TOBRAMYCIN-DEXAMETHASONE 0.3-0.1 % OP SUSP: 1.0000 [drp] | Freq: Four times a day (QID) | OPHTHALMIC | 0 refills | Status: AC

## 2021-09-30 NOTE — ED Triage Notes (Signed)
Pt presets for staples removal in head.   Pt reports pain and redness since this morning immediately after she place her eye contact. Pt wants to make sure is not ink eye.

## 2021-10-19 DIAGNOSIS — H04123 Dry eye syndrome of bilateral lacrimal glands: Secondary | ICD-10-CM | POA: Diagnosis not present

## 2021-11-10 ENCOUNTER — Encounter (HOSPITAL_COMMUNITY): Payer: Self-pay

## 2021-11-10 ENCOUNTER — Other Ambulatory Visit: Payer: Self-pay

## 2021-11-10 ENCOUNTER — Emergency Department (HOSPITAL_COMMUNITY): Payer: Medicare PPO

## 2021-11-10 ENCOUNTER — Emergency Department (HOSPITAL_COMMUNITY)
Admission: EM | Admit: 2021-11-10 | Discharge: 2021-11-10 | Disposition: A | Discharge status: Home / Self Care | Payer: Medicare PPO | Attending: Student | Admitting: Student

## 2021-11-10 DIAGNOSIS — Z20822 Contact with and (suspected) exposure to covid-19: Secondary | ICD-10-CM | POA: Insufficient documentation

## 2021-11-10 DIAGNOSIS — Z79899 Other long term (current) drug therapy: Secondary | ICD-10-CM | POA: Diagnosis not present

## 2021-11-10 DIAGNOSIS — R0602 Shortness of breath: Secondary | ICD-10-CM | POA: Insufficient documentation

## 2021-11-10 DIAGNOSIS — R0789 Other chest pain: Secondary | ICD-10-CM | POA: Insufficient documentation

## 2021-11-10 DIAGNOSIS — I10 Essential (primary) hypertension: Secondary | ICD-10-CM | POA: Diagnosis not present

## 2021-11-10 LAB — BASIC METABOLIC PANEL
Anion gap: 8 (ref 5–15)
BUN: 23 mg/dL (ref 8–23)
CO2: 24 mmol/L (ref 22–32)
Calcium: 9.3 mg/dL (ref 8.9–10.3)
Chloride: 101 mmol/L (ref 98–111)
Creatinine, Ser: 0.88 mg/dL (ref 0.44–1.00)
GFR, Estimated: >60 mL/min (ref >60)
Glucose, Bld: 186 mg/dL — ABNORMAL HIGH (ref 70–99)
Potassium: 3.4 mmol/L — ABNORMAL LOW (ref 3.5–5.1)
Sodium: 133 mmol/L — ABNORMAL LOW (ref 135–145)

## 2021-11-10 LAB — CBC
HCT: 46.3 % — ABNORMAL HIGH (ref 36.0–46.0)
Hemoglobin: 15.8 g/dL — ABNORMAL HIGH (ref 12.0–15.0)
MCH: 30.2 pg (ref 26.0–34.0)
MCHC: 34.1 g/dL (ref 30.0–36.0)
MCV: 88.5 fL (ref 80.0–100.0)
Platelets: 279 10*3/uL (ref 150–400)
RBC: 5.23 MIL/uL — ABNORMAL HIGH (ref 3.87–5.11)
RDW: 12.9 % (ref 11.5–15.5)
WBC: 8.1 10*3/uL (ref 4.0–10.5)
nRBC: 0 % (ref 0.0–0.2)

## 2021-11-10 LAB — RESP PANEL BY RT-PCR (FLU A&B, COVID) ARPGX2
Influenza A by PCR: NEGATIVE
Influenza B by PCR: NEGATIVE
SARS Coronavirus 2 by RT PCR: NEGATIVE

## 2021-11-10 LAB — TSH: TSH: 1.349 u[IU]/mL (ref 0.350–4.500)

## 2021-11-10 LAB — TROPONIN I (HIGH SENSITIVITY)
Troponin I (High Sensitivity): <2 ng/L (ref <18)
Troponin I (High Sensitivity): <2 ng/L (ref <18)

## 2021-11-10 LAB — D-DIMER, QUANTITATIVE: D-Dimer, Quant: 0.27 ug/mL-FEU (ref 0.00–0.50)

## 2021-11-10 NOTE — ED Triage Notes (Signed)
Pt states she has had chest tightness since Thursday. Pt states she has had extreme fatigue and has been diaphoretic. Pt endorses headache.

## 2021-11-10 NOTE — ED Notes (Signed)
Pt ambulated to bathroom and back to room without assistance, steady normal gait noted, no reports of dizziness.

## 2021-11-10 NOTE — ED Notes (Signed)
Per Rn ME EKG needed

## 2021-11-11 NOTE — ED Provider Notes (Signed)
[facility] EMERGENCY DEPARTMENT Provider Note  CSN: [id number] Arrival date & time: 11/10/21 1517  Chief Complaint(s) Chest Pain  HPI Courtney Marshall is a 66 y.o. female with PMH anxiety, diverticulitis, HTN, HLD who presents emergency department for evaluation of chest pain and shortness of breath.  Patient states that for the last 3 days she has had intermittent chest tightness and been feeling very fatigued.  She states that she was vacuuming her house and had an episode of diaphoresis with associated headache.  She states that she is never felt this before but has been feeling particularly anxious about this.  She denies associated abdominal pain, diarrhea, fever, cough or other systemic symptoms.  Denies hematochezia or hematemesis.  By the time that she has presented to the emergency department, she states her symptoms have completely resolved and she is feeling significantly improved.  Of note, husband does endorse that the patient snores and gasps for air in her sleep.   Past Medical History Past Medical History:  Diagnosis Date   Anxiety    Arthritis    Diverticulitis    Elevated hemoglobin A1c    GERD (gastroesophageal reflux disease)    Hyperlipidemia    Hypertension    PMB (postmenopausal bleeding) 11/03/2014   Had bleeding last year, after trying HRT by [facility], will get [place]   Trouble in sleeping 11/03/2014   Vaginal dryness 11/03/2014   Patient Active Problem List   Diagnosis Date Noted   Primary osteoarthritis of both knees 12/25/2020   GERD (gastroesophageal reflux disease) 06/28/2019   Dysphagia 06/28/2019   Encounter for gynecological examination with Papanicolaou smear of cervix 04/22/2018   Screening for colorectal cancer 04/22/2018   Special screening for malignant neoplasms, colon    Diverticulosis of large intestine without diverticulitis    Family history of osteoporosis 10/15/2016   Postmenopausal 10/15/2016   PMB (postmenopausal bleeding)  11/03/2014   Trouble in sleeping 11/03/2014   Vaginal dryness 11/03/2014   Home Medication(s) Prior to Admission medications   Medication Sig Start Date End Date Taking? Authorizing Provider  acetaminophen (TYLENOL) 500 MG tablet Take 1,000 mg by mouth every 6 (six) hours as needed.    [provider]  buPROPion (WELLBUTRIN XL) 150 MG 24 hr tablet Take 150 mg by mouth daily. 04/12/20   [provider]  calcium citrate-vitamin D (CITRACAL+D) 315-200 MG-UNIT tablet Take 1 tablet by mouth daily.    [provider]  Cholecalciferol (VITAMIN D3) 125 MCG (5000 UT) CAPS Take 1 capsule by mouth daily.    [provider]  escitalopram (LEXAPRO) 5 MG tablet Take 5 mg by mouth daily.    [provider]  losartan-hydrochlorothiazide (HYZAAR) 100-25 MG tablet Take 1 tablet by mouth daily.     [provider]  magnesium 30 MG tablet Take 30 mg by mouth 2 (two) times daily.    [provider]  meloxicam (MOBIC) 15 MG tablet Take 15 mg by mouth daily as needed. 12/22/20   [provider]  ondansetron (ZOFRAN ODT) 4 MG disintegrating tablet Take 1 tablet (4 mg total) by mouth every 8 (eight) hours as needed for nausea or vomiting. 06/21/20   Henderly, Britni A, PA-C  pantoprazole (PROTONIX) 40 MG tablet TAKE (1) TABLET BY MOUTH TWICE DAILY. Patient taking differently: Take 40 mg by mouth 2 (two) times daily. 02/28/20   Tiffany Kocher, PA-C  Past Surgical History Past Surgical History:  Procedure Laterality Date   CESAREAN SECTION     COLONOSCOPY N/A 12/16/2017   Procedure: COLONOSCOPY;  Surgeon: Franky Macho, MD;  [facility];  Service: Gastroenterology;  Laterality: N/A;   ESOPHAGOGASTRODUODENOSCOPY (EGD) WITH PROPOFOL N/A 07/01/2019   Procedure: ESOPHAGOGASTRODUODENOSCOPY (EGD) WITH PROPOFOL;   Surgeon: Corbin Ade, MD;  [facility];  Service: Endoscopy;  Laterality: N/A;  2:45pm   MALONEY DILATION N/A 07/01/2019   Procedure: Elease Hashimoto DILATION;  Surgeon: Corbin Ade, MD;  [facility];  Service: Endoscopy;  Laterality: N/A;   TONSILLECTOMY     TUBAL LIGATION     Family History Family History  Problem Relation Age of Onset   Osteoporosis Mother    Heart disease Father    Hypertension Father    Hyperlipidemia Father    Osteoporosis Sister    Osteoporosis Maternal Grandmother    Other Maternal Grandfather        hit by train   Heart attack Paternal Grandfather    Colon cancer Neg Hx     Social History Social History   Tobacco Use   Smoking status: Never   Smokeless tobacco: Never  Vaping Use   Vaping Use: Never used  Substance Use Topics   Alcohol use: No   Drug use: No   Allergies Lisinopril and Sulfa antibiotics  Review of Systems Review of Systems  Constitutional:  Positive for diaphoresis and fatigue.  Respiratory:  Positive for shortness of breath.   Cardiovascular:  Positive for chest pain.    Physical Exam Vital Signs  I have reviewed the triage vital signs BP 119/84   Pulse 78   Temp 98.2 F (36.8 C) (Oral)   Resp 20   Ht 5\' 4"  (1.626 m)   Wt 77.7 kg   SpO2 100%   BMI 29.39 kg/m   Physical Exam Vitals and nursing note reviewed.  Constitutional:      General: She is not in acute distress.    Appearance: She is well-developed.  HENT:     Head: Normocephalic and atraumatic.  Eyes:     Conjunctiva/sclera: Conjunctivae normal.  Cardiovascular:     Rate and Rhythm: Normal rate and regular rhythm.     Heart sounds: No murmur heard. Pulmonary:     Effort: Pulmonary effort is normal. No respiratory distress.     Breath sounds: Normal breath sounds.  Abdominal:     Palpations: Abdomen is soft.     Tenderness: There is no abdominal tenderness.  Musculoskeletal:        General: No swelling.     Cervical back:  Neck supple.  Skin:    General: Skin is warm and dry.     Capillary Refill: Capillary refill takes less than 2 seconds.  Neurological:     Mental Status: She is alert.  Psychiatric:        Mood and Affect: Mood normal.     ED Results and Treatments Labs (all labs ordered are listed, but only abnormal results are displayed) Labs Reviewed  BASIC METABOLIC PANEL - Abnormal; Notable for the following components:      Result Value   Sodium 133 (*)    Potassium 3.4 (*)    Glucose, Bld 186 (*)    All other components within normal limits  CBC - Abnormal; Notable for the following components:   RBC 5.23 (*)    Hemoglobin 15.8 (*)    HCT 46.3 (*)  All other components within normal limits  RESP PANEL BY RT-PCR (FLU A&B, COVID) ARPGX2  D-DIMER, QUANTITATIVE  TSH  TROPONIN I (HIGH SENSITIVITY)  TROPONIN I (HIGH SENSITIVITY)                                                                                                                          Radiology DG Chest 2 View  Result Date: 11/10/2021 CLINICAL DATA:  Chest tightness. EXAM: CHEST - 2 VIEW COMPARISON:  06/21/2020. FINDINGS: Cardiac silhouette is normal in size and configuration. Normal mediastinal and hilar contours. Minor linear atelectasis or scarring in the left upper lobe lingula. Lungs otherwise clear. No pleural effusion or pneumothorax. Skeletal structures are intact. IMPRESSION: No active cardiopulmonary disease. Electronically Signed   By: Amie Portlandavid  Ormond M.D.   On: 11/10/2021 16:15    Pertinent labs & imaging results that were available during my care of the patient were reviewed by me and considered in my medical decision making (see MDM for details).  Medications Ordered in ED Medications - No data to display                                                                                                                                   Procedures Procedures  (including critical care time)  Medical Decision  Making / ED Course   This patient presents to the ED for concern of chest pain, shortness of breath, fatigue, this involves an extensive number of treatment options, and is a complaint that carries with it a high risk of complications and morbidity.  The differential diagnosis includes ACS, PE, hypothyroidism, pneumonia, anxiety  MDM: Patient seen emergency room for evaluation of multiple complaints described above.  Physical exam is unremarkable.  Laboratory evaluation with a mild hypokalemia 3.4 and she was encouraged to increase her potassium containing foods in her diet, mild hyponatremia 133, hemoglobin 15.8.  ECG is nonischemic.  D-dimer is negative.  Troponin and delta troponin unremarkable with COVID and flu negative.  Chest x-ray unremarkable.  On reevaluation, patient able to ambulate without difficulty in the emergency department without return of her symptoms.  In the setting of an elevated hemoglobin, fatigue and headaches, suspect there may be an element of obstructive sleep apnea that is now becoming symptomatic and she will need to follow-up outpatient with her primary care physician to obtain an outpatient sleep study.  The patient's heart  score is 5 so we had an extended discussion about staying in the hospital for stress testing versus outpatient follow-up.  None of the inpatient hospital units including any [facility] or [facility] will be able to perform a stress test over the weekend and thus, the patient is electing to be discharged with outpatient follow-up.  I placed a cardiology referral to the [facility] and [facility] and given that the patient is totally asymptomatic here in the emergency department, I do not think that outpatient follow-up is unreasonable in this scenario.  She was given strict return precautions and discharged outpatient follow-up.   Additional history obtained: -Additional history obtained from husband -External records from outside source obtained and reviewed  including: Chart review including previous notes, labs, imaging, consultation notes   Lab Tests: -I ordered, reviewed, and interpreted labs.   The pertinent results include:   Labs Reviewed  BASIC METABOLIC PANEL - Abnormal; Notable for the following components:      Result Value   Sodium 133 (*)    Potassium 3.4 (*)    Glucose, Bld 186 (*)    All other components within normal limits  CBC - Abnormal; Notable for the following components:   RBC 5.23 (*)    Hemoglobin 15.8 (*)    HCT 46.3 (*)    All other components within normal limits  RESP PANEL BY RT-PCR (FLU A&B, COVID) ARPGX2  D-DIMER, QUANTITATIVE  TSH  TROPONIN I (HIGH SENSITIVITY)  TROPONIN I (HIGH SENSITIVITY)      EKG   EKG Interpretation  Date/Time:  Saturday November 10 2021 18:33:58 EDT Ventricular Rate:  83 PR Interval:  144 QRS Duration: 85 QT Interval:  367 QTC Calculation: 432 R Axis:   -26 Text Interpretation: Sinus rhythm Borderline left axis deviation Low voltage, precordial leads since last tracing no significant change Confirmed by Rolan Bucco [id number]) on 11/11/2021 11:18:54 AM         Imaging Studies ordered: I ordered imaging studies including chest x-ray I independently visualized and interpreted imaging. I agree with the radiologist interpretation   Medicines ordered and prescription drug management: No orders of the defined types were placed in this encounter.   -I have reviewed the patients home medicines and have made adjustments as needed  Critical interventions none    Cardiac Monitoring: The patient was maintained on a cardiac monitor.  I personally viewed and interpreted the cardiac monitored which showed an underlying rhythm of: NSR  Social Determinants of Health:  Factors impacting patients care include: none   Reevaluation: After the interventions noted above, I reevaluated the patient and found that they have :improved  Co morbidities that complicate the patient  evaluation  Past Medical History:  Diagnosis Date   Anxiety    Arthritis    Diverticulitis    Elevated hemoglobin A1c    GERD (gastroesophageal reflux disease)    Hyperlipidemia    Hypertension    PMB (postmenopausal bleeding) 11/03/2014   Had bleeding last year, after trying HRT by [facility], will get [place]   Trouble in sleeping 11/03/2014   Vaginal dryness 11/03/2014      Dispostion: I considered admission for this patient, but she currently does not meet inpatient criteria for admission and is electing for outpatient cardiology follow-up with a heart score of 5 which is not unreasonable.  Patient then discharged.     Final Clinical Impression(s) / ED Diagnoses Final diagnoses:  Atypical chest pain     @PCDICTATION @  Glendora Score, MD 11/11/21 1450

## 2021-12-06 DIAGNOSIS — M170 Bilateral primary osteoarthritis of knee: Secondary | ICD-10-CM | POA: Diagnosis not present

## 2021-12-24 ENCOUNTER — Other Ambulatory Visit (HOSPITAL_COMMUNITY): Payer: Self-pay | Admitting: Family Medicine

## 2021-12-24 DIAGNOSIS — Z1231 Encounter for screening mammogram for malignant neoplasm of breast: Secondary | ICD-10-CM

## 2021-12-27 ENCOUNTER — Ambulatory Visit (HOSPITAL_COMMUNITY)
Admission: RE | Admit: 2021-12-27 | Discharge: 2021-12-27 | Disposition: A | Discharge status: Home / Self Care | Payer: Medicare PPO | Source: Ambulatory Visit | Attending: Family Medicine | Admitting: Family Medicine

## 2021-12-27 DIAGNOSIS — Z1231 Encounter for screening mammogram for malignant neoplasm of breast: Secondary | ICD-10-CM | POA: Insufficient documentation

## 2022-02-07 ENCOUNTER — Other Ambulatory Visit (HOSPITAL_COMMUNITY)
Admission: RE | Admit: 2022-02-07 | Discharge: 2022-02-07 | Disposition: A | Discharge status: Home / Self Care | Payer: Medicare PPO | Source: Skilled Nursing Facility | Attending: Adult Health | Admitting: Adult Health

## 2022-02-07 ENCOUNTER — Encounter: Payer: Self-pay | Admitting: Adult Health

## 2022-02-07 ENCOUNTER — Ambulatory Visit (INDEPENDENT_AMBULATORY_CARE_PROVIDER_SITE_OTHER): Payer: Medicare PPO | Admitting: Adult Health

## 2022-02-07 VITALS — BP 133/91 | HR 88 | Ht 63.0 in | Wt 178.0 lb

## 2022-02-07 DIAGNOSIS — Z780 Asymptomatic menopausal state: Secondary | ICD-10-CM

## 2022-02-07 DIAGNOSIS — I10 Essential (primary) hypertension: Secondary | ICD-10-CM | POA: Diagnosis not present

## 2022-02-07 DIAGNOSIS — Z1211 Encounter for screening for malignant neoplasm of colon: Secondary | ICD-10-CM

## 2022-02-07 DIAGNOSIS — Z1151 Encounter for screening for human papillomavirus (HPV): Secondary | ICD-10-CM | POA: Diagnosis not present

## 2022-02-07 DIAGNOSIS — Z01419 Encounter for gynecological examination (general) (routine) without abnormal findings: Secondary | ICD-10-CM | POA: Insufficient documentation

## 2022-02-07 LAB — HEMOCCULT GUIAC POC 1CARD (OFFICE): Fecal Occult Blood, POC: NEGATIVE

## 2022-02-07 NOTE — Progress Notes (Signed)
Patient ID: Courtney Marshall, female   DOB: 10-01-1955, 66 y.o.   MRN: [id number] History of Present Illness: Courtney Marshall is a 66 year old white female, married, PM in for a well woman gyn exam and pap. She is having knee pain and gets injection and has noticed fluid retention. She is retired and keeps grand daughter some. She is headed to [employer] today.  She has appt with PCP DR Courtney Marshall next week  PCP is Dr Courtney Marshall   Current Medications, Allergies, Past Medical History, Past Surgical History, Family History and Social History were reviewed in [facility] record.     Review of Systems: Patient denies any headaches, hearing loss, fatigue, blurred vision, shortness of breath, chest pain, abdominal pain, problems with bowel movements, urination, or intercourse. No joint pain or mood swings.  She denies any vaginal bleeding  See HPI for positives    Physical Exam:BP (!) 133/91 (BP Location: Left Arm, Cuff Size: Normal)   Pulse 88   Ht 5\' 3"  (1.6 m)   Wt 178 lb (80.7 kg)   BMI 31.53 kg/m   General:  Well developed, well nourished, no acute distress Skin:  Warm and dry Neck:  Midline trachea, normal thyroid, good ROM, no lymphadenopathy,no carotid bruits heard  Lungs; Clear to auscultation bilaterally Breast:  No dominant palpable mass, retraction, or nipple discharge Cardiovascular: Regular rate and rhythm Abdomen:  Soft, non tender, no hepatosplenomegaly Pelvic:  External genitalia is normal in appearance, no lesions.  The vagina is pale with loss of rugae. Urethra has no lesions or masses. The cervix is smooth, pap with HR HPV genotyping performed.   Uterus is felt to be normal size, shape, and contour.  No adnexal masses or tenderness noted.Bladder is non tender, no masses felt. Rectal: Good sphincter tone, no polyps, or hemorrhoids felt.  Hemoccult negative. Extremities/musculoskeletal:  No swelling or varicosities noted, no clubbing or cyanosis Psych:  No mood  changes, alert and cooperative,seems happy AA is 0 Fall risk is low    02/07/2022    1:39 PM 66/15/2020   10:01 AM 10/15/2016    9:37 AM  Depression screen PHQ 2/9  Decreased Interest 1 0 0  Down, Depressed, Hopeless 1 0 0  PHQ - 2 Score 2 0 0  Altered sleeping 1    Tired, decreased energy 1    Change in appetite 1    Feeling bad or failure about yourself  0    Trouble concentrating 0    Moving slowly or fidgety/restless 0    Suicidal thoughts 0    PHQ-9 Score 5     She is on Wellbutrin and lexapro     02/07/2022    1:39 PM  GAD 7 : Generalized Anxiety Score  Nervous, Anxious, on Edge 1  Control/stop worrying 1  Worry too much - different things 1  Trouble relaxing 1  Restless 0  Easily annoyed or irritable 0  Afraid - awful might happen 1  Total GAD 7 Score 5    Upstream - 02/07/22 1344       Pregnancy Intention Screening   Does the patient want to become pregnant in the next year? N/A    Does the patient's partner want to become pregnant in the next year? N/A    Would the patient like to discuss contraceptive options today? N/A      Contraception Wrap Up   Current Method No Method - Other Reason;Female Sterilization   postmenopausal  End Method Female Sterilization;No Method - Other Reason    Contraception Counseling Provided No              Examination chaperoned by Andrez Grime RN   Impression and Plan: 1. Encounter for gynecological examination with Papanicolaou smear of cervix Pap sent Physical with PCP  Pap in 3 years if normal Labs with PCP Colonoscopy per GI Had negative mammogram 12/27/21.  Stay active   2. Postmenopausal Denies any  vaginal bleeding   3. Encounter for screening fecal occult blood testing Hemoccult was negative   4. Hypertension, unspecified type Keep check on BP Take meds and follow up with PCP next week

## 2022-02-13 LAB — CYTOLOGY - PAP
Comment: NEGATIVE
Diagnosis: NEGATIVE
High risk HPV: NEGATIVE

## 2022-02-14 ENCOUNTER — Other Ambulatory Visit (HOSPITAL_COMMUNITY): Payer: Self-pay | Admitting: Family Medicine

## 2022-02-14 DIAGNOSIS — Z1331 Encounter for screening for depression: Secondary | ICD-10-CM | POA: Diagnosis not present

## 2022-02-14 DIAGNOSIS — E7849 Other hyperlipidemia: Secondary | ICD-10-CM | POA: Diagnosis not present

## 2022-02-14 DIAGNOSIS — F419 Anxiety disorder, unspecified: Secondary | ICD-10-CM | POA: Diagnosis not present

## 2022-02-14 DIAGNOSIS — Z0001 Encounter for general adult medical examination with abnormal findings: Secondary | ICD-10-CM | POA: Diagnosis not present

## 2022-02-14 DIAGNOSIS — I10 Essential (primary) hypertension: Secondary | ICD-10-CM | POA: Diagnosis not present

## 2022-02-14 DIAGNOSIS — M1711 Unilateral primary osteoarthritis, right knee: Secondary | ICD-10-CM | POA: Diagnosis not present

## 2022-02-14 DIAGNOSIS — Z6830 Body mass index (BMI) 30.0-30.9, adult: Secondary | ICD-10-CM | POA: Diagnosis not present

## 2022-02-14 DIAGNOSIS — Z23 Encounter for immunization: Secondary | ICD-10-CM | POA: Diagnosis not present

## 2022-02-14 DIAGNOSIS — E782 Mixed hyperlipidemia: Secondary | ICD-10-CM | POA: Diagnosis not present

## 2022-02-14 DIAGNOSIS — E039 Hypothyroidism, unspecified: Secondary | ICD-10-CM | POA: Diagnosis not present

## 2022-02-18 ENCOUNTER — Ambulatory Visit (HOSPITAL_COMMUNITY)
Admission: RE | Admit: 2022-02-18 | Discharge: 2022-02-18 | Disposition: A | Discharge status: Home / Self Care | Payer: Medicare PPO | Source: Ambulatory Visit | Attending: Family Medicine | Admitting: Family Medicine

## 2022-02-18 DIAGNOSIS — E7849 Other hyperlipidemia: Secondary | ICD-10-CM | POA: Insufficient documentation

## 2022-02-18 DIAGNOSIS — E785 Hyperlipidemia, unspecified: Secondary | ICD-10-CM | POA: Diagnosis not present

## 2022-03-08 DIAGNOSIS — M170 Bilateral primary osteoarthritis of knee: Secondary | ICD-10-CM | POA: Diagnosis not present

## 2022-03-14 DIAGNOSIS — M170 Bilateral primary osteoarthritis of knee: Secondary | ICD-10-CM | POA: Diagnosis not present

## 2022-03-21 DIAGNOSIS — M1712 Unilateral primary osteoarthritis, left knee: Secondary | ICD-10-CM | POA: Diagnosis not present

## 2022-03-21 DIAGNOSIS — M170 Bilateral primary osteoarthritis of knee: Secondary | ICD-10-CM | POA: Diagnosis not present

## 2022-03-21 DIAGNOSIS — M1711 Unilateral primary osteoarthritis, right knee: Secondary | ICD-10-CM | POA: Diagnosis not present

## 2022-04-09 ENCOUNTER — Ambulatory Visit: Payer: Self-pay

## 2022-05-09 DIAGNOSIS — M170 Bilateral primary osteoarthritis of knee: Secondary | ICD-10-CM | POA: Diagnosis not present

## 2022-05-09 DIAGNOSIS — M1711 Unilateral primary osteoarthritis, right knee: Secondary | ICD-10-CM | POA: Diagnosis not present

## 2022-05-09 DIAGNOSIS — M1712 Unilateral primary osteoarthritis, left knee: Secondary | ICD-10-CM | POA: Diagnosis not present

## 2022-06-06 ENCOUNTER — Encounter: Payer: Self-pay | Admitting: Radiology

## 2022-06-13 IMAGING — MG MM DIGITAL SCREENING BILAT W/ TOMO AND CAD
6 of 10 series · 6 of 30 positions shown · non-contrast
Comparison: Previous exam(s).

CLINICAL DATA: Screening.

EXAM:
DIGITAL SCREENING BILATERAL MAMMOGRAM WITH TOMOSYNTHESIS AND CAD
TECHNIQUE: Bilateral screening digital craniocaudal and mediolateral oblique
mammograms were obtained. Bilateral screening digital breast
tomosynthesis was performed. The images were evaluated with
computer-aided detection.

[L CC synth-2D]
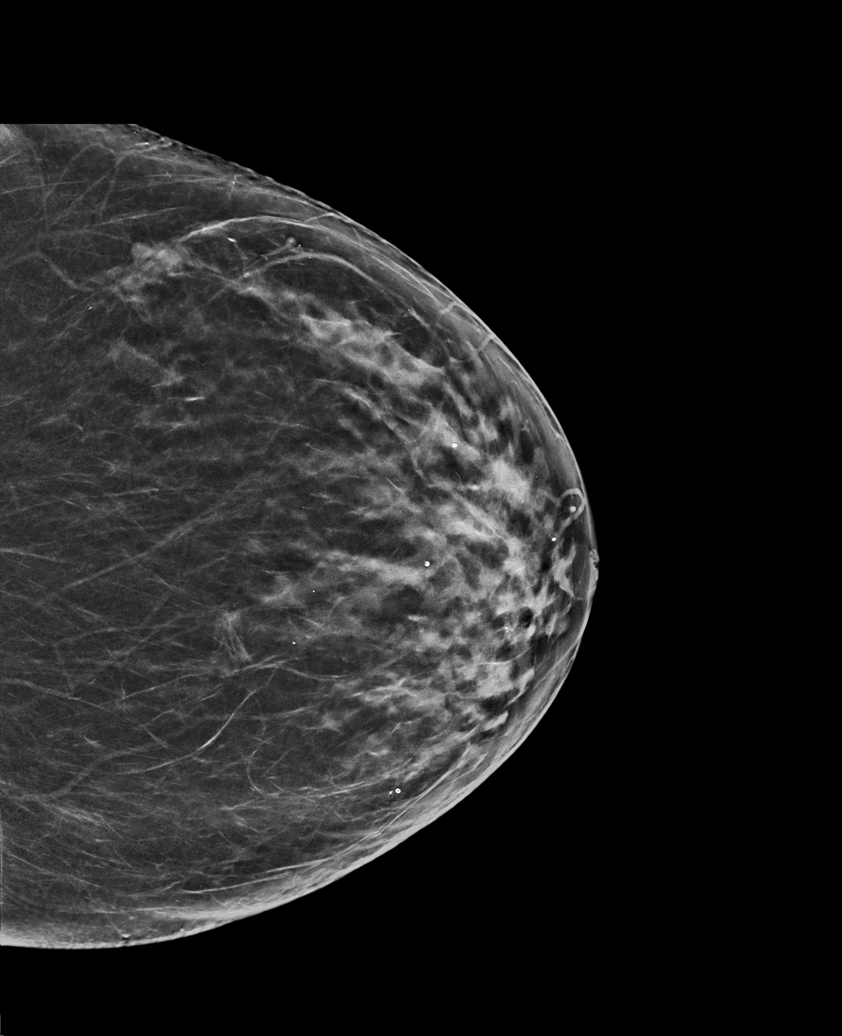

[R MLO synth-2D (1 of 2)]
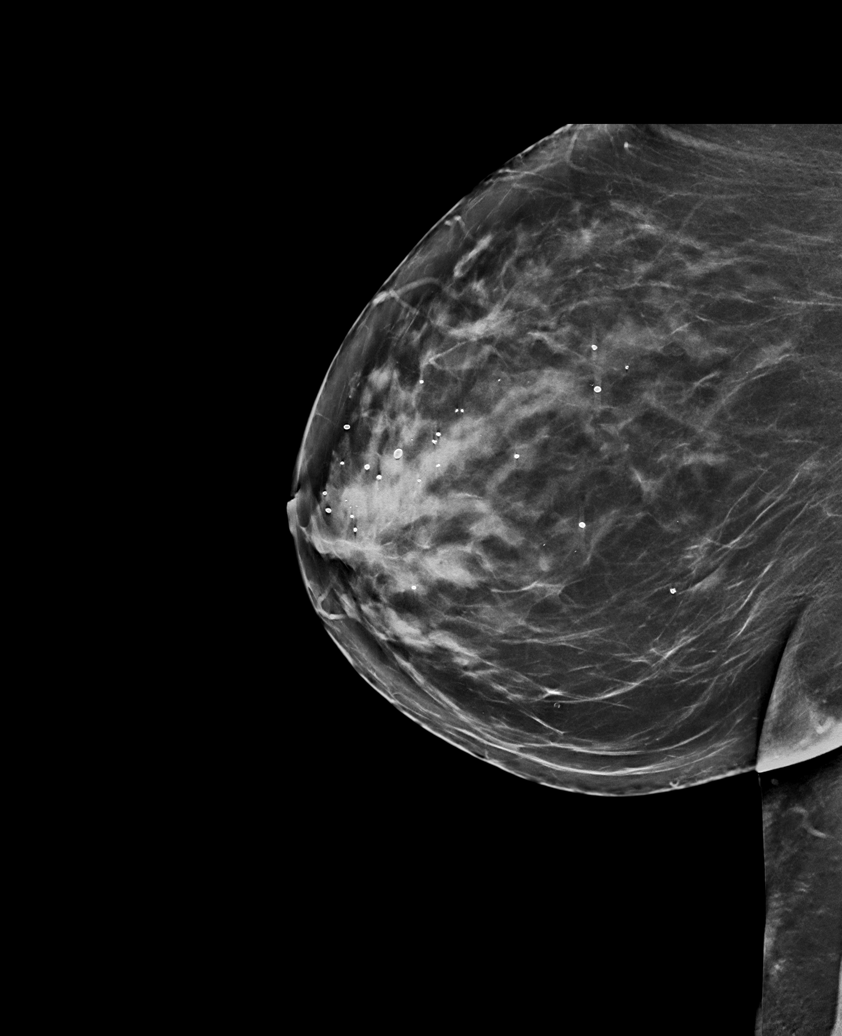

[R CC synth-2D]
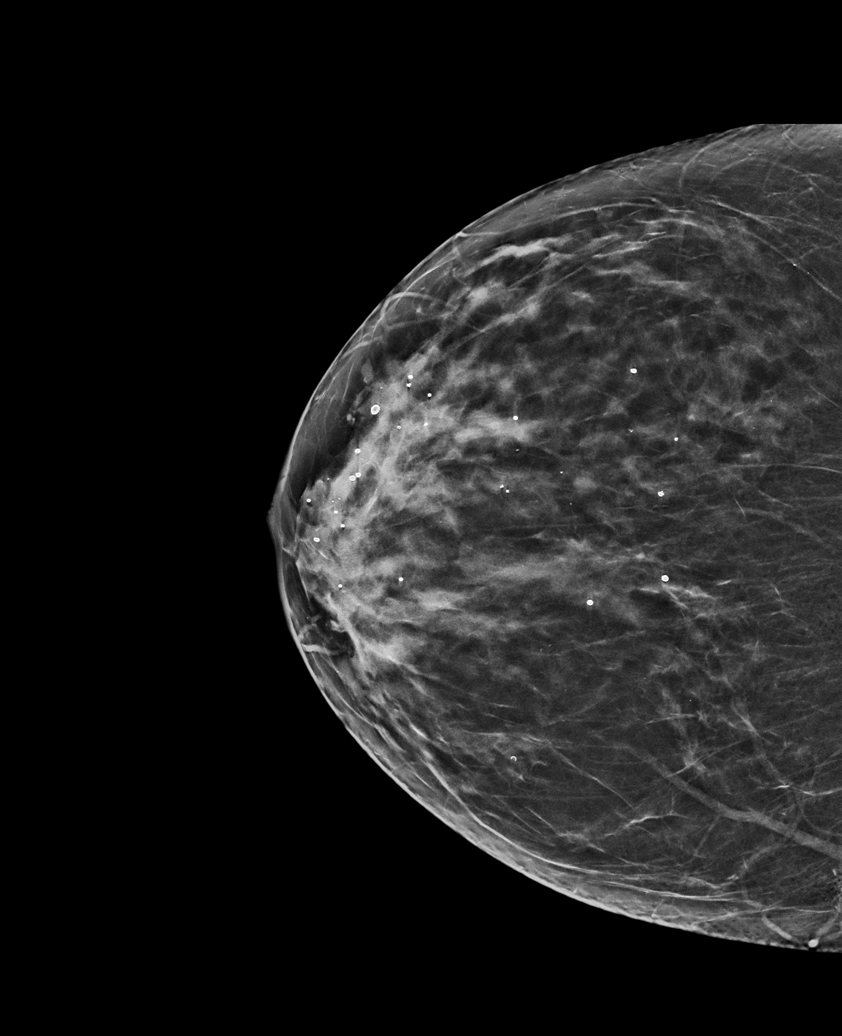

[L MLO synth-2D]
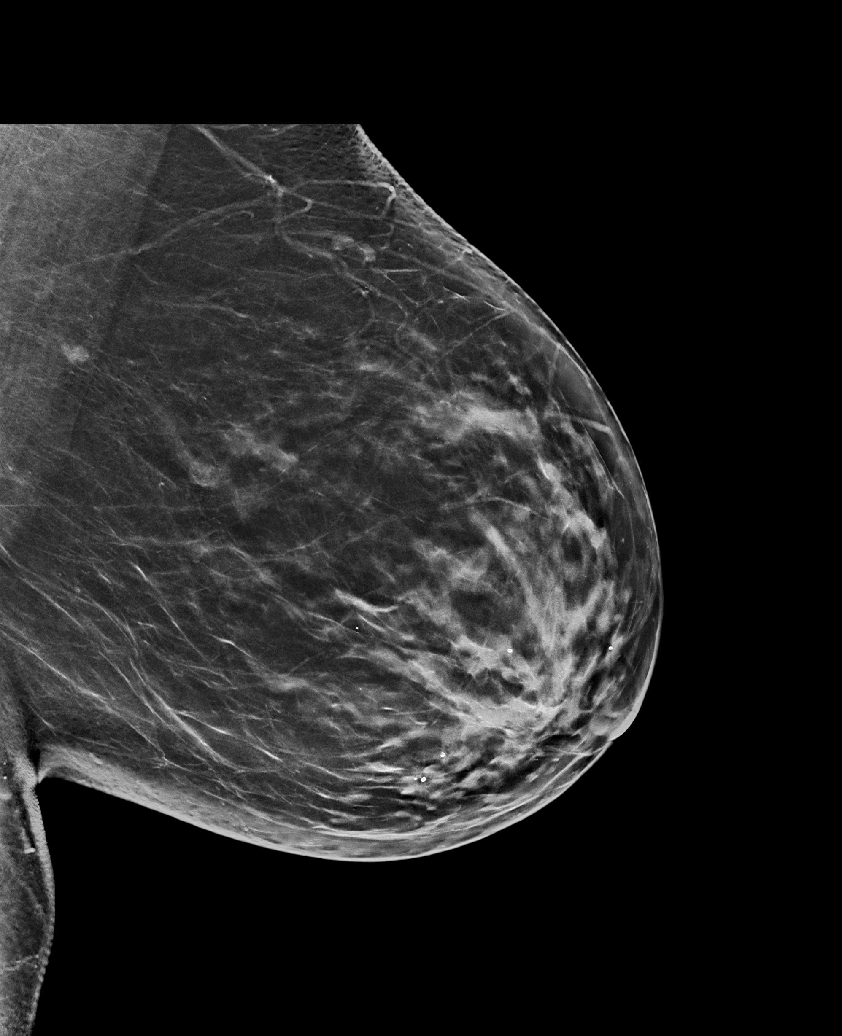

[R MLO synth-2D (2 of 2)]
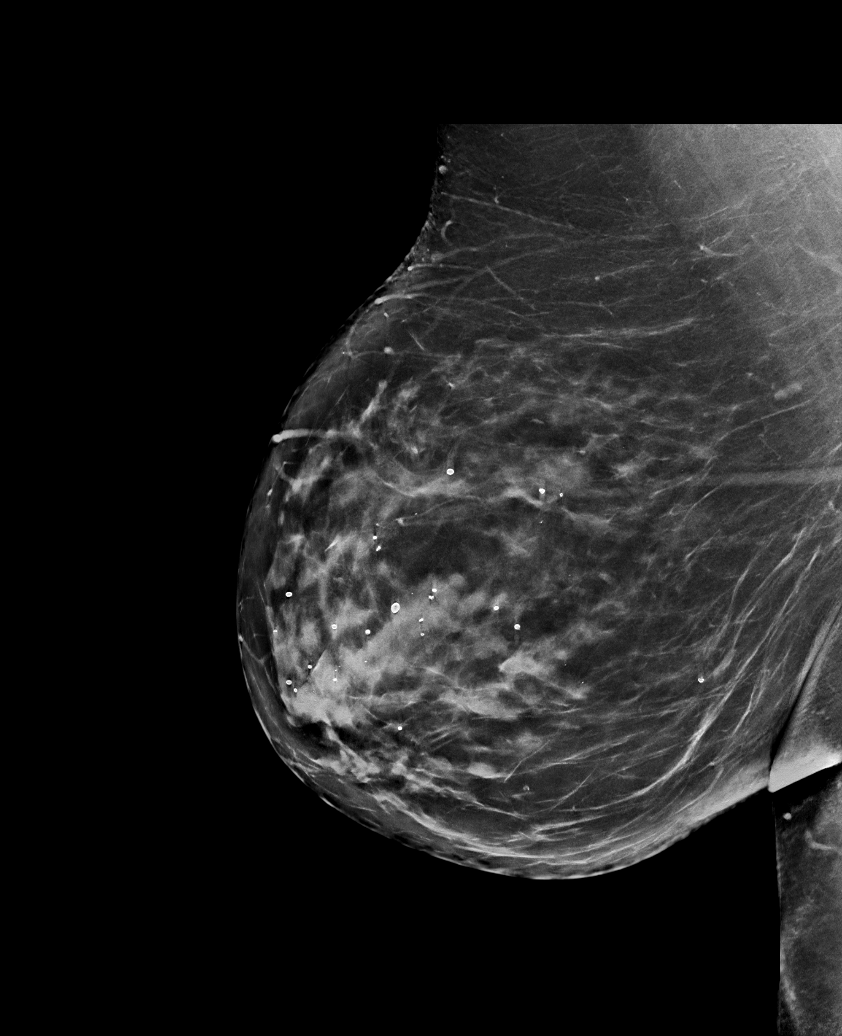

[L CC tomo · tomo slice 37/73.0]
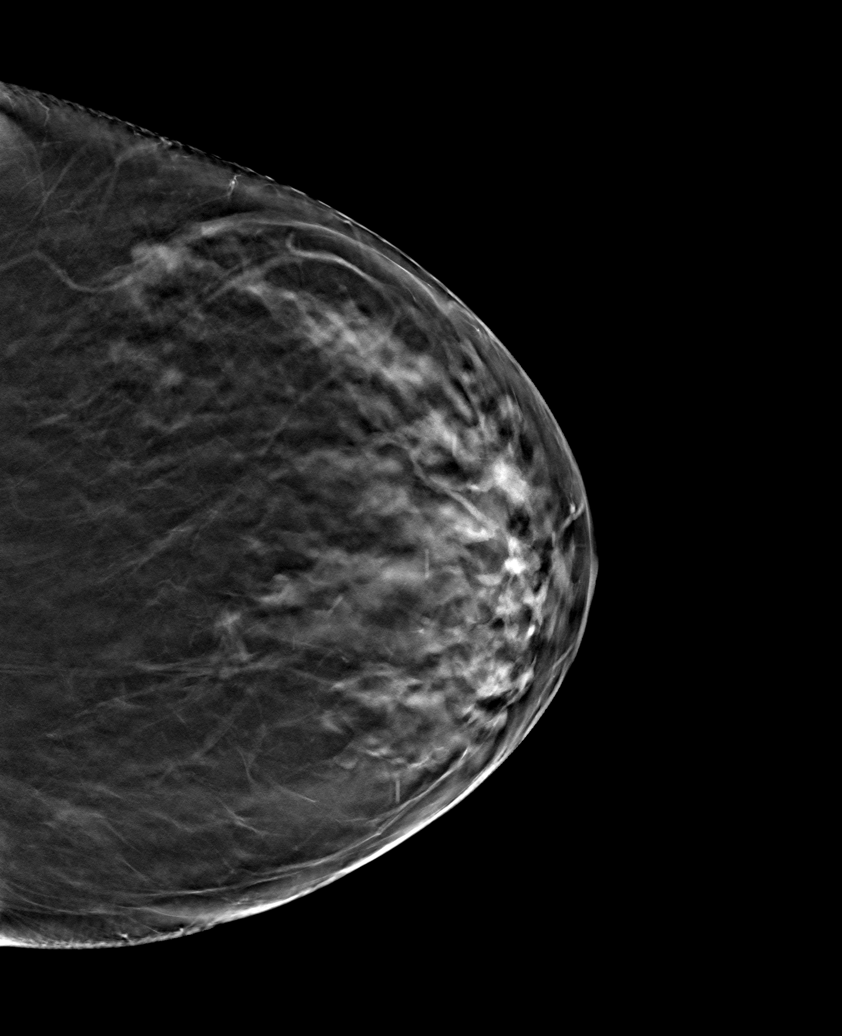

[6 of 30 positions shown; findings below may reference images not displayed]

ACR Breast Density Category c: The breast tissue is heterogeneously
dense, which may obscure small masses.
FINDINGS: There are no findings suspicious for malignancy.
IMPRESSION: No mammographic evidence of malignancy. A result letter of this
screening mammogram will be mailed directly to the patient.

RECOMMENDATION:
Screening mammogram in one year. (Code:Q3-W-BC3)

BI-RADS CATEGORY  1: Negative.

## 2022-09-12 DIAGNOSIS — R7309 Other abnormal glucose: Secondary | ICD-10-CM | POA: Diagnosis not present

## 2022-09-12 DIAGNOSIS — Z0001 Encounter for general adult medical examination with abnormal findings: Secondary | ICD-10-CM | POA: Diagnosis not present

## 2022-09-12 DIAGNOSIS — Z01818 Encounter for other preprocedural examination: Secondary | ICD-10-CM | POA: Diagnosis not present

## 2022-09-12 DIAGNOSIS — E119 Type 2 diabetes mellitus without complications: Secondary | ICD-10-CM | POA: Diagnosis not present

## 2022-09-12 DIAGNOSIS — E7849 Other hyperlipidemia: Secondary | ICD-10-CM | POA: Diagnosis not present

## 2022-09-12 DIAGNOSIS — I10 Essential (primary) hypertension: Secondary | ICD-10-CM | POA: Diagnosis not present

## 2022-09-12 DIAGNOSIS — E782 Mixed hyperlipidemia: Secondary | ICD-10-CM | POA: Diagnosis not present

## 2022-09-12 DIAGNOSIS — Z6830 Body mass index (BMI) 30.0-30.9, adult: Secondary | ICD-10-CM | POA: Diagnosis not present

## 2022-09-12 DIAGNOSIS — E6609 Other obesity due to excess calories: Secondary | ICD-10-CM | POA: Diagnosis not present

## 2022-09-12 DIAGNOSIS — E039 Hypothyroidism, unspecified: Secondary | ICD-10-CM | POA: Diagnosis not present

## 2022-10-07 DIAGNOSIS — M25561 Pain in right knee: Secondary | ICD-10-CM | POA: Diagnosis not present

## 2022-10-15 DIAGNOSIS — D225 Melanocytic nevi of trunk: Secondary | ICD-10-CM | POA: Diagnosis not present

## 2022-10-15 DIAGNOSIS — Z1283 Encounter for screening for malignant neoplasm of skin: Secondary | ICD-10-CM | POA: Diagnosis not present

## 2022-10-15 DIAGNOSIS — L570 Actinic keratosis: Secondary | ICD-10-CM | POA: Diagnosis not present

## 2022-10-15 DIAGNOSIS — X32XXXA Exposure to sunlight, initial encounter: Secondary | ICD-10-CM | POA: Diagnosis not present

## 2022-10-24 DIAGNOSIS — H5213 Myopia, bilateral: Secondary | ICD-10-CM | POA: Diagnosis not present

## 2022-10-24 DIAGNOSIS — H524 Presbyopia: Secondary | ICD-10-CM | POA: Diagnosis not present

## 2022-11-05 DIAGNOSIS — M1711 Unilateral primary osteoarthritis, right knee: Secondary | ICD-10-CM | POA: Diagnosis not present

## 2022-11-05 DIAGNOSIS — G8918 Other acute postprocedural pain: Secondary | ICD-10-CM | POA: Diagnosis not present

## 2022-11-05 DIAGNOSIS — M25761 Osteophyte, right knee: Secondary | ICD-10-CM | POA: Diagnosis not present

## 2022-11-07 DIAGNOSIS — M25561 Pain in right knee: Secondary | ICD-10-CM | POA: Diagnosis not present

## 2022-11-07 DIAGNOSIS — M25661 Stiffness of right knee, not elsewhere classified: Secondary | ICD-10-CM | POA: Diagnosis not present

## 2022-11-11 DIAGNOSIS — M25661 Stiffness of right knee, not elsewhere classified: Secondary | ICD-10-CM | POA: Diagnosis not present

## 2022-11-11 DIAGNOSIS — M25561 Pain in right knee: Secondary | ICD-10-CM | POA: Diagnosis not present

## 2022-11-13 DIAGNOSIS — M25661 Stiffness of right knee, not elsewhere classified: Secondary | ICD-10-CM | POA: Diagnosis not present

## 2022-11-13 DIAGNOSIS — M25561 Pain in right knee: Secondary | ICD-10-CM | POA: Diagnosis not present

## 2022-11-15 DIAGNOSIS — M25561 Pain in right knee: Secondary | ICD-10-CM | POA: Diagnosis not present

## 2022-11-15 DIAGNOSIS — M25661 Stiffness of right knee, not elsewhere classified: Secondary | ICD-10-CM | POA: Diagnosis not present

## 2022-11-18 DIAGNOSIS — M25661 Stiffness of right knee, not elsewhere classified: Secondary | ICD-10-CM | POA: Diagnosis not present

## 2022-11-18 DIAGNOSIS — M25561 Pain in right knee: Secondary | ICD-10-CM | POA: Diagnosis not present

## 2022-11-20 DIAGNOSIS — M25561 Pain in right knee: Secondary | ICD-10-CM | POA: Diagnosis not present

## 2022-11-20 DIAGNOSIS — M25661 Stiffness of right knee, not elsewhere classified: Secondary | ICD-10-CM | POA: Diagnosis not present

## 2022-11-22 DIAGNOSIS — M25661 Stiffness of right knee, not elsewhere classified: Secondary | ICD-10-CM | POA: Diagnosis not present

## 2022-11-22 DIAGNOSIS — M25561 Pain in right knee: Secondary | ICD-10-CM | POA: Diagnosis not present

## 2022-11-25 DIAGNOSIS — M25661 Stiffness of right knee, not elsewhere classified: Secondary | ICD-10-CM | POA: Diagnosis not present

## 2022-11-25 DIAGNOSIS — M25561 Pain in right knee: Secondary | ICD-10-CM | POA: Diagnosis not present

## 2022-11-27 DIAGNOSIS — M25561 Pain in right knee: Secondary | ICD-10-CM | POA: Diagnosis not present

## 2022-11-27 DIAGNOSIS — M25661 Stiffness of right knee, not elsewhere classified: Secondary | ICD-10-CM | POA: Diagnosis not present

## 2022-11-29 DIAGNOSIS — M25661 Stiffness of right knee, not elsewhere classified: Secondary | ICD-10-CM | POA: Diagnosis not present

## 2022-11-29 DIAGNOSIS — M25561 Pain in right knee: Secondary | ICD-10-CM | POA: Diagnosis not present

## 2022-12-02 DIAGNOSIS — M25661 Stiffness of right knee, not elsewhere classified: Secondary | ICD-10-CM | POA: Diagnosis not present

## 2022-12-02 DIAGNOSIS — M25561 Pain in right knee: Secondary | ICD-10-CM | POA: Diagnosis not present

## 2022-12-04 DIAGNOSIS — M25561 Pain in right knee: Secondary | ICD-10-CM | POA: Diagnosis not present

## 2022-12-04 DIAGNOSIS — M25661 Stiffness of right knee, not elsewhere classified: Secondary | ICD-10-CM | POA: Diagnosis not present

## 2022-12-06 DIAGNOSIS — M25561 Pain in right knee: Secondary | ICD-10-CM | POA: Diagnosis not present

## 2022-12-06 DIAGNOSIS — M25661 Stiffness of right knee, not elsewhere classified: Secondary | ICD-10-CM | POA: Diagnosis not present

## 2022-12-10 DIAGNOSIS — M25561 Pain in right knee: Secondary | ICD-10-CM | POA: Diagnosis not present

## 2022-12-10 DIAGNOSIS — Z5189 Encounter for other specified aftercare: Secondary | ICD-10-CM | POA: Diagnosis not present

## 2022-12-10 DIAGNOSIS — M25661 Stiffness of right knee, not elsewhere classified: Secondary | ICD-10-CM | POA: Diagnosis not present

## 2022-12-12 DIAGNOSIS — M25561 Pain in right knee: Secondary | ICD-10-CM | POA: Diagnosis not present

## 2022-12-12 DIAGNOSIS — M25661 Stiffness of right knee, not elsewhere classified: Secondary | ICD-10-CM | POA: Diagnosis not present

## 2022-12-18 DIAGNOSIS — M25561 Pain in right knee: Secondary | ICD-10-CM | POA: Diagnosis not present

## 2022-12-18 DIAGNOSIS — M25661 Stiffness of right knee, not elsewhere classified: Secondary | ICD-10-CM | POA: Diagnosis not present

## 2022-12-20 DIAGNOSIS — M25561 Pain in right knee: Secondary | ICD-10-CM | POA: Diagnosis not present

## 2022-12-20 DIAGNOSIS — M25661 Stiffness of right knee, not elsewhere classified: Secondary | ICD-10-CM | POA: Diagnosis not present

## 2022-12-23 DIAGNOSIS — M25561 Pain in right knee: Secondary | ICD-10-CM | POA: Diagnosis not present

## 2022-12-23 DIAGNOSIS — M25661 Stiffness of right knee, not elsewhere classified: Secondary | ICD-10-CM | POA: Diagnosis not present

## 2022-12-26 DIAGNOSIS — M25561 Pain in right knee: Secondary | ICD-10-CM | POA: Diagnosis not present

## 2022-12-26 DIAGNOSIS — M25661 Stiffness of right knee, not elsewhere classified: Secondary | ICD-10-CM | POA: Diagnosis not present

## 2022-12-30 DIAGNOSIS — M25561 Pain in right knee: Secondary | ICD-10-CM | POA: Diagnosis not present

## 2022-12-30 DIAGNOSIS — M25661 Stiffness of right knee, not elsewhere classified: Secondary | ICD-10-CM | POA: Diagnosis not present

## 2023-01-03 ENCOUNTER — Other Ambulatory Visit (HOSPITAL_COMMUNITY): Payer: Self-pay | Admitting: Family Medicine

## 2023-01-03 DIAGNOSIS — Z1231 Encounter for screening mammogram for malignant neoplasm of breast: Secondary | ICD-10-CM

## 2023-01-06 DIAGNOSIS — M25561 Pain in right knee: Secondary | ICD-10-CM | POA: Diagnosis not present

## 2023-01-16 ENCOUNTER — Encounter (HOSPITAL_COMMUNITY): Payer: Self-pay

## 2023-01-16 ENCOUNTER — Ambulatory Visit (HOSPITAL_COMMUNITY)
Admission: RE | Admit: 2023-01-16 | Discharge: 2023-01-16 | Disposition: A | Discharge status: Home / Self Care | Payer: Medicare HMO | Source: Ambulatory Visit | Attending: Family Medicine | Admitting: Family Medicine

## 2023-01-16 DIAGNOSIS — Z1231 Encounter for screening mammogram for malignant neoplasm of breast: Secondary | ICD-10-CM | POA: Diagnosis not present

## 2023-01-27 DIAGNOSIS — Z0189 Encounter for other specified special examinations: Secondary | ICD-10-CM | POA: Diagnosis not present

## 2023-02-13 DIAGNOSIS — H1031 Unspecified acute conjunctivitis, right eye: Secondary | ICD-10-CM | POA: Diagnosis not present

## 2023-02-18 DIAGNOSIS — M1712 Unilateral primary osteoarthritis, left knee: Secondary | ICD-10-CM | POA: Diagnosis not present

## 2023-02-18 DIAGNOSIS — G8918 Other acute postprocedural pain: Secondary | ICD-10-CM | POA: Diagnosis not present

## 2023-02-18 DIAGNOSIS — M25762 Osteophyte, left knee: Secondary | ICD-10-CM | POA: Diagnosis not present

## 2023-02-21 DIAGNOSIS — M25562 Pain in left knee: Secondary | ICD-10-CM | POA: Diagnosis not present

## 2023-02-21 DIAGNOSIS — M25662 Stiffness of left knee, not elsewhere classified: Secondary | ICD-10-CM | POA: Diagnosis not present

## 2023-02-24 DIAGNOSIS — M25662 Stiffness of left knee, not elsewhere classified: Secondary | ICD-10-CM | POA: Diagnosis not present

## 2023-02-24 DIAGNOSIS — M25562 Pain in left knee: Secondary | ICD-10-CM | POA: Diagnosis not present

## 2023-02-26 DIAGNOSIS — M25562 Pain in left knee: Secondary | ICD-10-CM | POA: Diagnosis not present

## 2023-02-26 DIAGNOSIS — M25662 Stiffness of left knee, not elsewhere classified: Secondary | ICD-10-CM | POA: Diagnosis not present

## 2023-02-28 DIAGNOSIS — M25562 Pain in left knee: Secondary | ICD-10-CM | POA: Diagnosis not present

## 2023-02-28 DIAGNOSIS — M25662 Stiffness of left knee, not elsewhere classified: Secondary | ICD-10-CM | POA: Diagnosis not present

## 2023-03-03 DIAGNOSIS — M25562 Pain in left knee: Secondary | ICD-10-CM | POA: Diagnosis not present

## 2023-03-03 DIAGNOSIS — M25662 Stiffness of left knee, not elsewhere classified: Secondary | ICD-10-CM | POA: Diagnosis not present

## 2023-03-05 DIAGNOSIS — M25562 Pain in left knee: Secondary | ICD-10-CM | POA: Diagnosis not present

## 2023-03-05 DIAGNOSIS — M25662 Stiffness of left knee, not elsewhere classified: Secondary | ICD-10-CM | POA: Diagnosis not present

## 2023-03-10 DIAGNOSIS — M25562 Pain in left knee: Secondary | ICD-10-CM | POA: Diagnosis not present

## 2023-03-10 DIAGNOSIS — M25662 Stiffness of left knee, not elsewhere classified: Secondary | ICD-10-CM | POA: Diagnosis not present

## 2023-03-12 DIAGNOSIS — M25562 Pain in left knee: Secondary | ICD-10-CM | POA: Diagnosis not present

## 2023-03-12 DIAGNOSIS — M25662 Stiffness of left knee, not elsewhere classified: Secondary | ICD-10-CM | POA: Diagnosis not present

## 2023-03-14 DIAGNOSIS — M25562 Pain in left knee: Secondary | ICD-10-CM | POA: Diagnosis not present

## 2023-03-14 DIAGNOSIS — M25662 Stiffness of left knee, not elsewhere classified: Secondary | ICD-10-CM | POA: Diagnosis not present

## 2023-03-17 DIAGNOSIS — M25662 Stiffness of left knee, not elsewhere classified: Secondary | ICD-10-CM | POA: Diagnosis not present

## 2023-03-17 DIAGNOSIS — M25562 Pain in left knee: Secondary | ICD-10-CM | POA: Diagnosis not present

## 2023-03-20 ENCOUNTER — Ambulatory Visit
Admission: RE | Admit: 2023-03-20 | Discharge: 2023-03-20 | Disposition: A | Discharge status: Home / Self Care | Payer: Medicare HMO | Source: Ambulatory Visit | Attending: Nurse Practitioner | Admitting: Nurse Practitioner

## 2023-03-20 ENCOUNTER — Other Ambulatory Visit: Payer: Self-pay

## 2023-03-20 VITALS — BP 146/88 | HR 80 | Temp 98.0°F | Resp 18

## 2023-03-20 DIAGNOSIS — Z79899 Other long term (current) drug therapy: Secondary | ICD-10-CM | POA: Insufficient documentation

## 2023-03-20 DIAGNOSIS — Z1152 Encounter for screening for COVID-19: Secondary | ICD-10-CM

## 2023-03-20 DIAGNOSIS — R197 Diarrhea, unspecified: Secondary | ICD-10-CM

## 2023-03-20 LAB — POCT URINALYSIS DIP (MANUAL ENTRY)
Blood, UA: NEGATIVE
Glucose, UA: NEGATIVE mg/dL
Nitrite, UA: NEGATIVE
Protein Ur, POC: 30 mg/dL — AB
Spec Grav, UA: 1.025 (ref 1.010–1.025)
Urobilinogen, UA: 0.2 U/dL
pH, UA: 6 (ref 5.0–8.0)

## 2023-03-20 MED ORDER — LOPERAMIDE HCL 2 MG PO CAPS
2.0000 mg | ORAL_CAPSULE | Freq: Four times a day (QID) | ORAL | 0 refills | Status: DC | PRN
Start: 2023-03-20 — End: 2024-01-26

## 2023-03-20 MED ORDER — ONDANSETRON 4 MG PO TBDP: 4.0000 mg | ORAL_TABLET | Freq: Three times a day (TID) | ORAL | 0 refills | Status: AC | PRN

## 2023-03-20 NOTE — ED Provider Notes (Signed)
[facility]    CSN: [id number] Arrival date & time: 03/20/23  0946      History   Chief Complaint Chief Complaint  Patient presents with   Diarrhea    Headache and body aches - Entered by patient    HPI Courtney Marshall is a 67 y.o. female.   The history is provided by the patient.   Patient presents for complaints of diarrhea, body aches, and headache with some chills that started over the past 2 to 3 days.  Patient denies fever, nasal congestion, runny nose, cough, vomiting, or constipation.  She reports when symptoms started she experienced approximately 15-20 episodes of diarrhea.  She states over the past 24 hours she has now had at least 3-4 episodes, but states that her stool has become more formed.  She does report some abdominal soreness in the lower quadrants.  Patient states prior to her symptoms starting, she did eat out, she also had some foods from [employer] that were once recalled, but have since been restarted.  Patient states that her daughter and grandchildren experienced the same or similar symptoms, but their symptoms did not last this long.  She reports she has been nervous about eating.  States that she has been drinking.  States that she had had toast over the past 24 hours.  She did have an old prescription of Zofran that she took, which she states did help some of her diarrhea.  Past Medical History:  Diagnosis Date   Anxiety    Arthritis    Diverticulitis    Elevated hemoglobin A1c    GERD (gastroesophageal reflux disease)    Hyperlipidemia    Hypertension    PMB (postmenopausal bleeding) 11/03/2014   Had bleeding last year, after trying HRT by [facility], will get [place]   Trouble in sleeping 11/03/2014   Vaginal dryness 11/03/2014    Patient Active Problem List   Diagnosis Date Noted   Hypertension 02/07/2022   Encounter for screening fecal occult blood testing 02/07/2022   Primary osteoarthritis of both knees 12/25/2020   GERD  (gastroesophageal reflux disease) 06/28/2019   Dysphagia 06/28/2019   Encounter for gynecological examination with Papanicolaou smear of cervix 04/22/2018   Screening for colorectal cancer 04/22/2018   Special screening for malignant neoplasms, colon    Diverticulosis of large intestine without diverticulitis    Family history of osteoporosis 10/15/2016   Postmenopausal 10/15/2016   PMB (postmenopausal bleeding) 11/03/2014   Trouble in sleeping 11/03/2014   Vaginal dryness 11/03/2014    Past Surgical History:  Procedure Laterality Date   CESAREAN SECTION     COLONOSCOPY N/A 12/16/2017   Procedure: COLONOSCOPY;  Surgeon: Franky Macho, MD;  [facility];  Service: Gastroenterology;  Laterality: N/A;   ESOPHAGOGASTRODUODENOSCOPY (EGD) WITH PROPOFOL N/A 07/01/2019   Procedure: ESOPHAGOGASTRODUODENOSCOPY (EGD) WITH PROPOFOL;  Surgeon: Corbin Ade, MD;  Location: [facility];  Service: Endoscopy;  Laterality: N/A;  2:45pm   MALONEY DILATION N/A 07/01/2019   Procedure: Elease Hashimoto DILATION;  Surgeon: Corbin Ade, MD;  Location: [facility];  Service: Endoscopy;  Laterality: N/A;   REPLACEMENT TOTAL KNEE BILATERAL     TONSILLECTOMY     TUBAL LIGATION      OB History     Gravida  3   Para  3   Term  3   Preterm      AB      Living  3  SAB      IAB      Ectopic      Multiple      Live Births  3            Home Medications    Prior to Admission medications   Medication Sig Start Date End Date Taking? Authorizing Provider  loperamide (IMODIUM) 2 MG capsule Take 1 capsule (2 mg total) by mouth 4 (four) times daily as needed for diarrhea or loose stools. 03/20/23  Yes Leath-Warren, Sadie Haber, NP  ondansetron (ZOFRAN-ODT) 4 MG disintegrating tablet Take 1 tablet (4 mg total) by mouth every 8 (eight) hours as needed. 03/20/23  Yes Leath-Warren, Sadie Haber, NP  acetaminophen (TYLENOL) 500 MG tablet Take 1,000 mg by mouth every 6 (six) hours as  needed.    [provider]  buPROPion (WELLBUTRIN XL) 150 MG 24 hr tablet Take 150 mg by mouth daily. 04/12/20   [provider]  calcium citrate-vitamin D (CITRACAL+D) 315-200 MG-UNIT tablet Take 1 tablet by mouth daily.    [provider]  Cholecalciferol (VITAMIN D3) 125 MCG (5000 UT) CAPS Take 1 capsule by mouth daily.    [provider]  escitalopram (LEXAPRO) 5 MG tablet Take 5 mg by mouth daily.    [provider]  losartan-hydrochlorothiazide (HYZAAR) 100-25 MG tablet Take 1 tablet by mouth daily.     [provider]  magnesium 30 MG tablet Take 30 mg by mouth 2 (two) times daily.    [provider]  pantoprazole (PROTONIX) 40 MG tablet TAKE (1) TABLET BY MOUTH TWICE DAILY. Patient taking differently: Take 40 mg by mouth 2 (two) times daily. 02/28/20   Tiffany Kocher, PA-C    Family History Family History  Problem Relation Age of Onset   Heart attack Paternal Grandfather    Osteoporosis Maternal Grandmother    Other Maternal Grandfather        hit by train   Heart disease Father    Hypertension Father    Hyperlipidemia Father    Osteoporosis Mother    Osteoporosis Sister    Colon cancer Neg Hx     Social History Social History   Tobacco Use   Smoking status: Never   Smokeless tobacco: Never  Vaping Use   Vaping status: Never Used  Substance Use Topics   Alcohol use: No   Drug use: No     Allergies   Lisinopril and Sulfa antibiotics   Review of Systems Review of Systems Per HPI  Physical Exam Triage Vital Signs ED Triage Vitals  Encounter Vitals Group     BP 03/20/23 0956 (!) 146/88     Systolic BP Percentile --      Diastolic BP Percentile --      Pulse Rate 03/20/23 0956 80     Resp 03/20/23 0956 18     Temp 03/20/23 0956 98 F (36.7 C)     Temp Source 03/20/23 0956 Oral     SpO2 03/20/23 0956 95 %     Weight --      Height --      Head Circumference --      Peak Flow --      Pain  Score 03/20/23 0957 4     Pain Loc --      Pain Education --      Exclude from Growth Chart --    No data found.  Updated Vital Signs BP (!) 146/88 (BP Location: Right  Arm)   Pulse 80   Temp 98 F (36.7 C) (Oral)   Resp 18   SpO2 95%   Visual Acuity Right Eye Distance:   Left Eye Distance:   Bilateral Distance:    Right Eye Near:   Left Eye Near:    Bilateral Near:     Physical Exam Vitals and nursing note reviewed.  Constitutional:      General: She is not in acute distress.    Appearance: Normal appearance.  HENT:     Head: Normocephalic.  Eyes:     Extraocular Movements: Extraocular movements intact.     Pupils: Pupils are equal, round, and reactive to light.  Cardiovascular:     Rate and Rhythm: Regular rhythm.     Pulses: Normal pulses.     Heart sounds: Normal heart sounds.  Pulmonary:     Effort: Pulmonary effort is normal. No respiratory distress.     Breath sounds: Normal breath sounds. No stridor. No wheezing, rhonchi or rales.  Abdominal:     General: Abdomen is flat. Bowel sounds are normal.     Palpations: Abdomen is soft.     Tenderness: There is abdominal tenderness in the right lower quadrant and left lower quadrant.  Musculoskeletal:     Cervical back: Normal range of motion.  Skin:    General: Skin is warm and dry.  Neurological:     General: No focal deficit present.     Mental Status: She is alert and oriented to person, place, and time.  Psychiatric:        Mood and Affect: Mood normal.        Behavior: Behavior normal.      UC Treatments / Results  Labs (all labs ordered are listed, but only abnormal results are displayed) Labs Reviewed  POCT URINALYSIS DIP (MANUAL ENTRY) - Abnormal; Notable for the following components:      Result Value   Clarity, UA hazy (*)    Bilirubin, UA small (*)    Ketones, POC UA trace (5) (*)    Protein Ur, POC =30 (*)    Leukocytes, UA Trace (*)    All other components within normal limits  SARS  CORONAVIRUS 2 (TAT 6-24 HRS)  CBC WITH DIFFERENTIAL/PLATELET  COMPREHENSIVE METABOLIC PANEL    EKG   Radiology No results found.  Procedures Procedures (including critical care time)  Medications Ordered in UC Medications - No data to display  Initial Impression / Assessment and Plan / UC Course  I have reviewed the triage vital signs and the nursing notes.  Pertinent labs & imaging results that were available during my care of the patient were reviewed by me and considered in my medical decision making (see chart for details).  Patient with diarrhea that has been present for the past 2 to 3 days.  Symptoms have improved per the patient's report.  On exam, she does have some bilateral lower quadrant tenderness.  Do not suspect acute abdomen as patient has been afebrile, and is currently well-appearing.  COVID test, CBC and CMP are pending, urinalysis does not indicate obvious infection, urine culture is also pending.  Will start patient on Zofran 4 mg ODT for nausea, and Imodium 2 mg for diarrhea.  Discussed at length with patient open follow-up will be indicated.  Patient was in agreement with this plan of care and verbalized understanding.  All questions were answered.  Patient stable for discharge.  Final Clinical Impressions(s) / UC Diagnoses  Final diagnoses:  Diarrhea, unspecified type  Encounter for screening for COVID-19     Discharge Instructions      COVID test, urine culture, CBC and CMP are pending.  You will be contacted if the pending test results are abnormal.  You also have access to your results via MyChart. Take medication as prescribed. Increase fluids and allow for plenty of rest.  As discussed, recommend Pedialyte or Gatorolyte to help with dehydration. Recommend a diet that will add bulk to your stool to help decrease diarrhea.  I provided information for you to refer to. As discussed, if you experience worsening symptoms despite use of medications, I  would like for you to go to the emergency department for further evaluation. Follow-up as needed.     ED Prescriptions     Medication Sig Dispense Auth. Provider   ondansetron (ZOFRAN-ODT) 4 MG disintegrating tablet Take 1 tablet (4 mg total) by mouth every 8 (eight) hours as needed. 20 tablet Leath-Warren, Sadie Haber, NP   loperamide (IMODIUM) 2 MG capsule Take 1 capsule (2 mg total) by mouth 4 (four) times daily as needed for diarrhea or loose stools. 12 capsule Leath-Warren, Sadie Haber, NP      PDMP not reviewed this encounter.   Abran Cantor, NP 03/20/23 1038

## 2023-03-20 NOTE — Discharge Instructions (Addendum)
COVID test, urine culture, CBC and CMP are pending.  You will be contacted if the pending test results are abnormal.  You also have access to your results via MyChart. Take medication as prescribed. Increase fluids and allow for plenty of rest.  As discussed, recommend Pedialyte or Gatorolyte to help with dehydration. Recommend a diet that will add bulk to your stool to help decrease diarrhea.  I provided information for you to refer to. As discussed, if you experience worsening symptoms despite use of medications, I would like for you to go to the emergency department for further evaluation. Follow-up as needed.

## 2023-03-20 NOTE — ED Triage Notes (Signed)
Diarrhea since Monday, also c/o body aches and headache with some chills.

## 2023-03-21 LAB — COMPREHENSIVE METABOLIC PANEL
ALT: 21 [IU]/L (ref 0–32)
AST: 23 [IU]/L (ref 0–40)
Albumin: 4.5 g/dL (ref 3.9–4.9)
Alkaline Phosphatase: 60 [IU]/L (ref 44–121)
BUN/Creatinine Ratio: 17 (ref 12–28)
BUN: 12 mg/dL (ref 8–27)
Bilirubin Total: 0.4 mg/dL (ref 0.0–1.2)
CO2: 24 mmol/L (ref 20–29)
Calcium: 10 mg/dL (ref 8.7–10.3)
Chloride: 104 mmol/L (ref 96–106)
Creatinine, Ser: 0.71 mg/dL (ref 0.57–1.00)
Globulin, Total: 2.2 g/dL (ref 1.5–4.5)
Glucose: 103 mg/dL — ABNORMAL HIGH (ref 70–99)
Potassium: 4.7 mmol/L (ref 3.5–5.2)
Sodium: 141 mmol/L (ref 134–144)
Total Protein: 6.7 g/dL (ref 6.0–8.5)
eGFR: 93 mL/min/{1.73_m2} (ref >59)

## 2023-03-21 LAB — URINE CULTURE
Culture: NO GROWTH
Special Requests: NORMAL

## 2023-03-21 LAB — CBC WITH DIFFERENTIAL/PLATELET
Basophils Absolute: 0 10*3/uL (ref 0.0–0.2)
Basos: 1 %
EOS (ABSOLUTE): 0.1 10*3/uL (ref 0.0–0.4)
Eos: 3 %
Hematocrit: 41.1 % (ref 34.0–46.6)
Hemoglobin: 13.7 g/dL (ref 11.1–15.9)
Immature Grans (Abs): 0 10*3/uL (ref 0.0–0.1)
Immature Granulocytes: 0 %
Lymphocytes Absolute: 1.2 10*3/uL (ref 0.7–3.1)
Lymphs: 32 %
MCH: 29.6 pg (ref 26.6–33.0)
MCHC: 33.3 g/dL (ref 31.5–35.7)
MCV: 89 fL (ref 79–97)
Monocytes Absolute: 0.5 10*3/uL (ref 0.1–0.9)
Monocytes: 12 %
Neutrophils Absolute: 2 10*3/uL (ref 1.4–7.0)
Neutrophils: 52 %
Platelets: 255 10*3/uL (ref 150–450)
RBC: 4.63 x10E6/uL (ref 3.77–5.28)
RDW: 13.1 % (ref 11.7–15.4)
WBC: 3.9 10*3/uL (ref 3.4–10.8)

## 2023-03-21 LAB — SARS CORONAVIRUS 2 (TAT 6-24 HRS): SARS Coronavirus 2: NEGATIVE

## 2023-03-27 DIAGNOSIS — M25662 Stiffness of left knee, not elsewhere classified: Secondary | ICD-10-CM | POA: Diagnosis not present

## 2023-03-27 DIAGNOSIS — M25562 Pain in left knee: Secondary | ICD-10-CM | POA: Diagnosis not present

## 2023-03-28 DIAGNOSIS — Z5189 Encounter for other specified aftercare: Secondary | ICD-10-CM | POA: Diagnosis not present

## 2023-04-04 DIAGNOSIS — M25562 Pain in left knee: Secondary | ICD-10-CM | POA: Diagnosis not present

## 2023-04-04 DIAGNOSIS — M25662 Stiffness of left knee, not elsewhere classified: Secondary | ICD-10-CM | POA: Diagnosis not present

## 2023-04-10 DIAGNOSIS — M25662 Stiffness of left knee, not elsewhere classified: Secondary | ICD-10-CM | POA: Diagnosis not present

## 2023-04-10 DIAGNOSIS — M25562 Pain in left knee: Secondary | ICD-10-CM | POA: Diagnosis not present

## 2023-04-11 ENCOUNTER — Ambulatory Visit
Admission: RE | Admit: 2023-04-11 | Discharge: 2023-04-11 | Disposition: A | Discharge status: Home / Self Care | Payer: No Typology Code available for payment source | Source: Ambulatory Visit | Attending: Family Medicine | Admitting: Family Medicine

## 2023-04-11 VITALS — BP 150/82 | HR 106 | Temp 98.0°F | Resp 16

## 2023-04-11 DIAGNOSIS — H5711 Ocular pain, right eye: Secondary | ICD-10-CM | POA: Diagnosis not present

## 2023-04-11 DIAGNOSIS — H5789 Other specified disorders of eye and adnexa: Secondary | ICD-10-CM

## 2023-04-11 NOTE — ED Provider Notes (Signed)
 [facility]    CSN: [id number] Arrival date & time: 04/11/23  1443      History   Chief Complaint Chief Complaint  Patient presents with   Eye Problem    Right eye is red and in pain - Entered by patient    HPI Courtney Marshall is a 68 y.o. female.   The history is provided by the patient.   Patient presents for complaints of right eye pain and irritation has been present for the past 2 months.  Patient states that the pain worsens throughout the day.  She states that the pains feels like a throbbing behind her eye.  She has been seeing her optometrist and he has tried medications such as Pataday, and Systane, with minimal relief.  Patient states that she was concerned because she is having worsening pain.  At this time, patient denies eye redness or eye pain.  She states that she wanted to have an evaluation to ensure that there was nothing else that can be done.  Patient states that she remembers symptoms starting after she used a new set of contacts.  She thought that the lot number on the contacts was recalled, but when she checked it was not.  Patient states that she has not been wearing her contacts, and has continued to have symptoms.  Patient's eye doctor did refer her to [facility], but states she cannot get an appointment until the end of January.  Patient states that she is noticing a change in her vision as well.  Past Medical History:  Diagnosis Date   Anxiety    Arthritis    Diverticulitis    Elevated hemoglobin A1c    GERD (gastroesophageal reflux disease)    Hyperlipidemia    Hypertension    PMB (postmenopausal bleeding) 11/03/2014   Had bleeding last year, after trying HRT by [facility], will get US    Trouble in sleeping 11/03/2014   Vaginal dryness 11/03/2014    Patient Active Problem List   Diagnosis Date Noted   Hypertension 02/07/2022   Encounter for screening fecal occult blood testing 02/07/2022   Primary osteoarthritis of both knees  12/25/2020   GERD (gastroesophageal reflux disease) 06/28/2019   Dysphagia 06/28/2019   Encounter for gynecological examination with Papanicolaou smear of cervix 04/22/2018   Screening for colorectal cancer 04/22/2018   Special screening for malignant neoplasms, colon    Diverticulosis of large intestine without diverticulitis    Family history of osteoporosis 10/15/2016   Postmenopausal 10/15/2016   PMB (postmenopausal bleeding) 11/03/2014   Trouble in sleeping 11/03/2014   Vaginal dryness 11/03/2014    Past Surgical History:  Procedure Laterality Date   CESAREAN SECTION     COLONOSCOPY N/A 12/16/2017   Procedure: COLONOSCOPY;  Surgeon: Mavis Anes, MD;  [facility];  Service: Gastroenterology;  Laterality: N/A;   ESOPHAGOGASTRODUODENOSCOPY (EGD) WITH PROPOFOL  N/A 07/01/2019   Procedure: ESOPHAGOGASTRODUODENOSCOPY (EGD) WITH PROPOFOL ;  Surgeon: Shaaron Lamar HERO, MD;  [facility];  Service: Endoscopy;  Laterality: N/A;  2:45pm   MALONEY DILATION N/A 07/01/2019   Procedure: AGAPITO DILATION;  Surgeon: Shaaron Lamar HERO, MD;  [facility];  Service: Endoscopy;  Laterality: N/A;   REPLACEMENT TOTAL KNEE BILATERAL     TONSILLECTOMY     TUBAL LIGATION      OB History     Gravida  3   Para  3   Term  3   Preterm  AB      Living  3      SAB      IAB      Ectopic      Multiple      Live Births  3            Home Medications    Prior to Admission medications   Medication Sig Start Date End Date Taking? Authorizing Provider  acetaminophen (TYLENOL) 500 MG tablet Take 1,000 mg by mouth every 6 (six) hours as needed.   Yes [provider]  buPROPion (WELLBUTRIN XL) 150 MG 24 hr tablet Take 150 mg by mouth daily. 04/12/20  Yes [provider]  calcium citrate-vitamin D (CITRACAL+D) 315-200 MG-UNIT tablet Take 1 tablet by mouth daily.   Yes [provider]  Cholecalciferol (VITAMIN D3) 125 MCG (5000  UT) CAPS Take 1 capsule by mouth daily.   Yes [provider]  escitalopram (LEXAPRO) 5 MG tablet Take 5 mg by mouth daily.   Yes [provider]  loperamide  (IMODIUM ) 2 MG capsule Take 1 capsule (2 mg total) by mouth 4 (four) times daily as needed for diarrhea or loose stools. 03/20/23  Yes Leath-Warren, Etta PARAS, NP  losartan-hydrochlorothiazide (HYZAAR) 100-25 MG tablet Take 1 tablet by mouth daily.    Yes [provider]  magnesium 30 MG tablet Take 30 mg by mouth 2 (two) times daily.   Yes [provider]  ondansetron  (ZOFRAN -ODT) 4 MG disintegrating tablet Take 1 tablet (4 mg total) by mouth every 8 (eight) hours as needed. 03/20/23  Yes Leath-Warren, Etta PARAS, NP  pantoprazole  (PROTONIX ) 40 MG tablet TAKE (1) TABLET BY MOUTH TWICE DAILY. Patient taking differently: Take 40 mg by mouth 2 (two) times daily. 02/28/20  Yes Ezzard Sonny RAMAN, PA-C    Family History Family History  Problem Relation Age of Onset   Heart attack Paternal Grandfather    Osteoporosis Maternal Grandmother    Other Maternal Grandfather        hit by train   Heart disease Father    Hypertension Father    Hyperlipidemia Father    Osteoporosis Mother    Osteoporosis Sister    Colon cancer Neg Hx     Social History Social History   Tobacco Use   Smoking status: Never   Smokeless tobacco: Never  Vaping Use   Vaping status: Never Used  Substance Use Topics   Alcohol use: No   Drug use: No     Allergies   Lisinopril and Sulfa antibiotics   Review of Systems Review of Systems Per HPI  Physical Exam Triage Vital Signs ED Triage Vitals [04/11/23 1533]  Encounter Vitals Group     BP (!) 150/82     Systolic BP Percentile      Diastolic BP Percentile      Pulse Rate (!) 106     Resp 16     Temp 98 F (36.7 C)     Temp Source Oral     SpO2 94 %     Weight      Height      Head Circumference      Peak Flow      Pain Score      Pain Loc      Pain  Education      Exclude from Growth Chart    No data found.  Updated Vital Signs BP (!) 150/82 (BP Location: Left Arm)   Pulse (!) 106  Temp 98 F (36.7 C) (Oral)   Resp 16   SpO2 94%   Visual Acuity Right Eye Distance:   Left Eye Distance:   Bilateral Distance:    Right Eye Near:   Left Eye Near:    Bilateral Near:     Physical Exam Vitals and nursing note reviewed.  Constitutional:      General: She is not in acute distress.    Appearance: Normal appearance.  HENT:     Head: Normocephalic.  Eyes:     General: Lids are normal. No visual field deficit.       Right eye: No foreign body, discharge or hordeolum.     Extraocular Movements: Extraocular movements intact.     Right eye: Normal extraocular motion and no nystagmus.     Conjunctiva/sclera:     Right eye: Right conjunctiva is not injected. No chemosis, exudate or hemorrhage.    Pupils: Pupils are equal, round, and reactive to light.  Skin:    General: Skin is warm and dry.  Neurological:     General: No focal deficit present.     Mental Status: She is alert and oriented to person, place, and time.  Psychiatric:        Mood and Affect: Mood normal.        Behavior: Behavior normal.      UC Treatments / Results  Labs (all labs ordered are listed, but only abnormal results are displayed) Labs Reviewed - No data to display  EKG   Radiology No results found.  Procedures Procedures (including critical care time)  Medications Ordered in UC Medications - No data to display  Initial Impression / Assessment and Plan / UC Course  I have reviewed the triage vital signs and the nursing notes.  Pertinent labs & imaging results that were available during my care of the patient were reviewed by me and considered in my medical decision making (see chart for details).  Patient with ongoing eye problems have been present for the past 2 months.  She was under the care of an optometrist who referred her to  [facility].  On exam, right eye is within normal limits at this time.  Do not suspect patient is experiencing an infection.  Discussed with patient options that she has already tried that would be other options for this provider to prescribe.  Discussion with patient regarding supportive care recommendations at home to include over-the-counter analgesics, and warm compresses.  Patient was advised to consider following up with ophthalmology sooner due to the change in her vision.  Patient was in agreement with this plan of care and verbalized understanding.  All questions were answered.  Patient stable for discharge.  Final Clinical Impressions(s) / UC Diagnoses   Final diagnoses:  None   Discharge Instructions   None    ED Prescriptions   None    PDMP not reviewed this encounter.   Gilmer Etta PARAS, NP 04/11/23 [phone]

## 2023-04-11 NOTE — ED Triage Notes (Signed)
 Right eye irritation and redness x 2 months. Denies any recent trauma to her eye.

## 2023-04-17 DIAGNOSIS — R7309 Other abnormal glucose: Secondary | ICD-10-CM | POA: Diagnosis not present

## 2023-04-17 DIAGNOSIS — Z6831 Body mass index (BMI) 31.0-31.9, adult: Secondary | ICD-10-CM | POA: Diagnosis not present

## 2023-04-17 DIAGNOSIS — Z1331 Encounter for screening for depression: Secondary | ICD-10-CM | POA: Diagnosis not present

## 2023-04-17 DIAGNOSIS — E039 Hypothyroidism, unspecified: Secondary | ICD-10-CM | POA: Diagnosis not present

## 2023-04-17 DIAGNOSIS — F419 Anxiety disorder, unspecified: Secondary | ICD-10-CM | POA: Diagnosis not present

## 2023-04-17 DIAGNOSIS — I10 Essential (primary) hypertension: Secondary | ICD-10-CM | POA: Diagnosis not present

## 2023-04-17 DIAGNOSIS — S0501XA Injury of conjunctiva and corneal abrasion without foreign body, right eye, initial encounter: Secondary | ICD-10-CM | POA: Diagnosis not present

## 2023-04-17 DIAGNOSIS — Z0001 Encounter for general adult medical examination with abnormal findings: Secondary | ICD-10-CM | POA: Diagnosis not present

## 2023-04-17 DIAGNOSIS — E782 Mixed hyperlipidemia: Secondary | ICD-10-CM | POA: Diagnosis not present

## 2023-04-17 DIAGNOSIS — E6609 Other obesity due to excess calories: Secondary | ICD-10-CM | POA: Diagnosis not present

## 2023-09-15 DIAGNOSIS — R7309 Other abnormal glucose: Secondary | ICD-10-CM | POA: Diagnosis not present

## 2023-09-15 DIAGNOSIS — I10 Essential (primary) hypertension: Secondary | ICD-10-CM | POA: Diagnosis not present

## 2023-09-15 DIAGNOSIS — E039 Hypothyroidism, unspecified: Secondary | ICD-10-CM | POA: Diagnosis not present

## 2023-09-15 DIAGNOSIS — E538 Deficiency of other specified B group vitamins: Secondary | ICD-10-CM | POA: Diagnosis not present

## 2023-09-15 DIAGNOSIS — K219 Gastro-esophageal reflux disease without esophagitis: Secondary | ICD-10-CM | POA: Diagnosis not present

## 2023-09-15 DIAGNOSIS — E6609 Other obesity due to excess calories: Secondary | ICD-10-CM | POA: Diagnosis not present

## 2023-09-15 DIAGNOSIS — Z6832 Body mass index (BMI) 32.0-32.9, adult: Secondary | ICD-10-CM | POA: Diagnosis not present

## 2023-09-15 DIAGNOSIS — E782 Mixed hyperlipidemia: Secondary | ICD-10-CM | POA: Diagnosis not present

## 2023-09-15 DIAGNOSIS — R10814 Left lower quadrant abdominal tenderness: Secondary | ICD-10-CM | POA: Diagnosis not present

## 2023-09-16 ENCOUNTER — Other Ambulatory Visit (HOSPITAL_COMMUNITY): Payer: Self-pay | Admitting: Family Medicine

## 2023-09-16 DIAGNOSIS — R10814 Left lower quadrant abdominal tenderness: Secondary | ICD-10-CM

## 2023-09-18 DIAGNOSIS — R7309 Other abnormal glucose: Secondary | ICD-10-CM | POA: Diagnosis not present

## 2023-09-18 DIAGNOSIS — E039 Hypothyroidism, unspecified: Secondary | ICD-10-CM | POA: Diagnosis not present

## 2023-09-18 DIAGNOSIS — E7849 Other hyperlipidemia: Secondary | ICD-10-CM | POA: Diagnosis not present

## 2023-09-18 DIAGNOSIS — K219 Gastro-esophageal reflux disease without esophagitis: Secondary | ICD-10-CM | POA: Diagnosis not present

## 2023-09-18 DIAGNOSIS — Z0001 Encounter for general adult medical examination with abnormal findings: Secondary | ICD-10-CM | POA: Diagnosis not present

## 2023-09-18 DIAGNOSIS — E559 Vitamin D deficiency, unspecified: Secondary | ICD-10-CM | POA: Diagnosis not present

## 2023-09-19 DIAGNOSIS — H04123 Dry eye syndrome of bilateral lacrimal glands: Secondary | ICD-10-CM | POA: Diagnosis not present

## 2023-09-24 ENCOUNTER — Encounter: Payer: Self-pay | Admitting: Internal Medicine

## 2023-09-25 ENCOUNTER — Ambulatory Visit (HOSPITAL_COMMUNITY)
Admission: RE | Admit: 2023-09-25 | Discharge: 2023-09-25 | Disposition: A | Discharge status: Home / Self Care | Source: Ambulatory Visit | Attending: Family Medicine | Admitting: Family Medicine

## 2023-09-25 DIAGNOSIS — K449 Diaphragmatic hernia without obstruction or gangrene: Secondary | ICD-10-CM | POA: Diagnosis not present

## 2023-09-25 DIAGNOSIS — R10814 Left lower quadrant abdominal tenderness: Secondary | ICD-10-CM | POA: Diagnosis not present

## 2023-09-25 DIAGNOSIS — K5730 Diverticulosis of large intestine without perforation or abscess without bleeding: Secondary | ICD-10-CM | POA: Diagnosis not present

## 2023-09-25 MED ORDER — IOHEXOL 300 MG/ML  SOLN
100.0000 mL | Freq: Once | INTRAMUSCULAR | Status: AC | PRN
  Administered 2023-09-25: 100 mL via INTRAVENOUS

## 2023-09-25 MED ORDER — IOHEXOL 9 MG/ML PO SOLN
ORAL | Status: AC
  Filled 2023-09-25: qty 1000

## 2023-10-02 ENCOUNTER — Encounter: Payer: Self-pay | Admitting: Gastroenterology

## 2023-10-16 ENCOUNTER — Ambulatory Visit: Admitting: Gastroenterology

## 2023-11-14 ENCOUNTER — Ambulatory Visit: Payer: Self-pay

## 2023-12-11 ENCOUNTER — Encounter: Payer: Self-pay | Admitting: Gastroenterology

## 2023-12-12 DIAGNOSIS — L820 Inflamed seborrheic keratosis: Secondary | ICD-10-CM | POA: Diagnosis not present

## 2023-12-12 DIAGNOSIS — Z85828 Personal history of other malignant neoplasm of skin: Secondary | ICD-10-CM | POA: Diagnosis not present

## 2023-12-12 DIAGNOSIS — L570 Actinic keratosis: Secondary | ICD-10-CM | POA: Diagnosis not present

## 2023-12-12 DIAGNOSIS — L821 Other seborrheic keratosis: Secondary | ICD-10-CM | POA: Diagnosis not present

## 2023-12-12 DIAGNOSIS — L438 Other lichen planus: Secondary | ICD-10-CM | POA: Diagnosis not present

## 2023-12-12 DIAGNOSIS — D0472 Carcinoma in situ of skin of left lower limb, including hip: Secondary | ICD-10-CM | POA: Diagnosis not present

## 2024-01-25 NOTE — Progress Notes (Unsigned)
 GI Office Note    Referring Provider: Marvine Rush, MD Primary Care Physician:  Marvine Rush, MD Primary Gastroenterologist: Lamar HERO.Rourk, MD  Date:  01/26/2024  ID:  Courtney Marshall, DOB 26-Mar-1956, MRN [id number]   Chief Complaint   Chief Complaint  Patient presents with   New Patient (Initial Visit)    Pt here for colonoscopy visit   History of Present Illness  Courtney Marshall is a 68 y.o. female with a history of diverticulitis in 2019, GERD, anxiety, HLD, and HTN presenting today with complaint of some prior abdominal pain and recurrent diverticulitis.  Colonoscopy March 2019 with Dr. Mavis: - Moderate diverticulosis in the sigmoid colon and in the descending colon. There was no evidence of diverticular bleeding.  - The entire examined colon - Advised repeat in 10 years.   Last office visit March 2021.Reported GERD symptoms and dysphagia that was somewhat improved with Pantoprazole  40 mg daily. Also taking probiotic. Reported acute diverticulitis in 2019. Dysphagia 1*2 times per week and GERD symptoms frequently throughout the week. Intermittent bloating noted. No GI bleeding or changes in appetite or weight. Advised increase in PPI to BID. EGD with dilation ordered.   EGD March 2021: - Mild erosive reflux esophagitis mild Schatzki ring. Dilated / disrupted.  - Small hiatal hernia.  - The examination was otherwise normal.  - Normal duodenal bulb and second portion of the duodenum.  - No specimens collected.  In June 2025 patient reported issues with bloating, lower back pain, and back pain to PCP.  Symptoms reported for 3 months. LL 2-3 hours at a time. Does note constipation and denied decreased appetite, nausea, vomiting. Did report some diarrhea also. Labs and CT A/P ordered.   CT A/P with contrast June 2025: - normal hepatobiliary system - normal pancreas - urothelial wall thickening - stool throughout majority of colon, negative for  appendicitis   Today:  Discussed the use of AI scribe software for clinical note transcription with the patient, who gave verbal consent to proceed.  She experiences recurrent abdominal pain primarily in the left lower quadrant, described as 'crampy,' since her first diverticulitis attack. Last diverticulitis episode in 2019. The pain often occurs after eating and is managed by switching to a liquid diet for a day or two, which usually resolves the symptoms.  Her bowel movements are inconsistent, ranging from diarrhea to watery stools, but never type 1, 2, or 3 on the [facility] Stool Chart. Usually varies between Bristol 4-7. She often feels backed up and not fully emptied after bowel movements. She uses Miralax as needed, which provides some relief, and has previously tried Linzess, which was effective but caused diarrhea, leading to irregular use, only took a few times and not consistent. She typically has bowel movements once or twice a day but sometimes skips a day or two, using Miralax with coffee to prevent going three days without a bowel movement. She feels constantly backed up, affecting her daily life and making her hesitant to travel or engage in activities due to fear of urgency and the pain.  She acknowledges not drinking enough water  or getting enough exercise, which she believes contributes to her symptoms. Her ability to exercise regularly has been impacted by bilateral knee replacements last year.  She has experienced vomiting two or three times in the last year, usually after eating, with spaghetti being a specific trigger. She takes Protonix  once a day for acid reflux. No black stools or blood in her stool. No  reports of dysphagia but does have history of this. States she was told in the past to take PPI twice daily but has only been taking once daily. Breakthrough symptoms usually occurring in the evenings.      Wt Readings from Last 5 Encounters:  01/26/24 183 lb 3.2 oz (83.1 kg)   02/07/22 178 lb (80.7 kg)  11/10/21 171 lb 3.2 oz (77.7 kg)  12/25/20 181 lb 3.2 oz (82.2 kg)  06/21/20 180 lb (81.6 kg)    Current Outpatient Medications  Medication Sig Dispense Refill   buPROPion (WELLBUTRIN XL) 150 MG 24 hr tablet Take 150 mg by mouth daily.     calcium citrate-vitamin D (CITRACAL+D) 315-200 MG-UNIT tablet Take 1 tablet by mouth daily.     escitalopram (LEXAPRO) 5 MG tablet Take 5 mg by mouth daily.     losartan-hydrochlorothiazide (HYZAAR) 100-25 MG tablet Take 1 tablet by mouth daily.      magnesium 30 MG tablet Take 30 mg by mouth 2 (two) times daily.     ondansetron  (ZOFRAN -ODT) 4 MG disintegrating tablet Take 1 tablet (4 mg total) by mouth every 8 (eight) hours as needed. 20 tablet 0   pantoprazole  (PROTONIX ) 40 MG tablet TAKE (1) TABLET BY MOUTH TWICE DAILY. 60 tablet 5   No current facility-administered medications for this visit.    Past Medical History:  Diagnosis Date   Anxiety    Arthritis    Diverticulitis    Elevated hemoglobin A1c    GERD (gastroesophageal reflux disease)    Hyperlipidemia    Hypertension    PMB (postmenopausal bleeding) 11/03/2014   Had bleeding last year, after trying HRT by [facility] will get US    Trouble in sleeping 11/03/2014   Vaginal dryness 11/03/2014    Past Surgical History:  Procedure Laterality Date   CESAREAN SECTION     COLONOSCOPY N/A 12/16/2017   Procedure: COLONOSCOPY;  Surgeon: Mavis Anes, MD;  [facility];  Service: Gastroenterology;  Laterality: N/A;   ESOPHAGOGASTRODUODENOSCOPY (EGD) WITH PROPOFOL  N/A 07/01/2019   Procedure: ESOPHAGOGASTRODUODENOSCOPY (EGD) WITH PROPOFOL ;  Surgeon: Shaaron Lamar HERO, MD;  [facility];  Service: Endoscopy;  Laterality: N/A;  2:45pm   MALONEY DILATION N/A 07/01/2019   Procedure: AGAPITO DILATION;  Surgeon: Shaaron Lamar HERO, MD;  [facility];  Service: Endoscopy;  Laterality: N/A;   REPLACEMENT TOTAL KNEE BILATERAL      TONSILLECTOMY     TUBAL LIGATION      Family History  Problem Relation Age of Onset   Osteoporosis Mother    Heart disease Father    Hypertension Father    Hyperlipidemia Father    Osteoporosis Sister    Osteoporosis Maternal Grandmother    Other Maternal Grandfather        hit by train   Heart attack Paternal Grandfather    Colon cancer Neg Hx    Colon polyps Neg Hx     Allergies as of 01/26/2024 - Review Complete 01/26/2024  Allergen Reaction Noted   Lisinopril Hives 10/25/2013   Sulfa antibiotics  12/10/2017    Social History   Socioeconomic History   Marital status: Married    Spouse name: Not on file   Number of children: Not on file   Years of education: Not on file   Highest education level: Not on file  Occupational History   Not on file  Tobacco Use   Smoking status: Never   Smokeless tobacco: Never  Vaping  Use   Vaping status: Never Used  Substance and Sexual Activity   Alcohol use: No   Drug use: No   Sexual activity: Yes    Birth control/protection: Surgical, Post-menopausal    Comment: tubal  Other Topics Concern   Not on file  Social History Narrative   Not on file   Social Drivers of Health   Financial Resource Strain: Low Risk  (02/07/2022)   Overall Financial Resource Strain (CARDIA)    Difficulty of Paying Living Expenses: Not hard at all  Food Insecurity: No Food Insecurity (02/07/2022)   Hunger Vital Sign    Worried About Running Out of Food in the Last Year: Never true    Ran Out of Food in the Last Year: Never true  Transportation Needs: No Transportation Needs (02/07/2022)   PRAPARE - Administrator, Civil Service (Medical): No    Lack of Transportation (Non-Medical): No  Physical Activity: Insufficiently Active (02/07/2022)   Exercise Vital Sign    Days of Exercise per Week: 3 days    Minutes of Exercise per Session: 30 min  Stress: Stress Concern Present (02/07/2022)   [employer] of Occupational Health -  Occupational Stress Questionnaire    Feeling of Stress : Rather much  Social Connections: Moderately Integrated (02/07/2022)   Social Connection and Isolation Panel    Frequency of Communication with Friends and Family: More than three times a week    Frequency of Social Gatherings with Friends and Family: Once a week    Attends Religious Services: More than 4 times per year    Active Member of [employer] or Organizations: No    Attends Banker Meetings: Never    Marital Status: Married     Review of Systems   Gen: Denies fever, chills, anorexia. Denies fatigue, weakness, weight loss.  CV: Denies chest pain, palpitations, syncope, peripheral edema, and claudication. Resp: Denies dyspnea at rest, cough, wheezing, coughing up blood, and pleurisy. GI: See HPI Derm: Denies rash, itching, dry skin Psych: Denies depression, anxiety, memory loss, confusion. No homicidal or suicidal ideation.  Heme: Denies bruising, bleeding, and enlarged lymph nodes.  Physical Exam   BP 126/83   Pulse 76   Temp 98.6 F (37 C)   Ht 5' 4 (1.626 m)   Wt 183 lb 3.2 oz (83.1 kg)   BMI 31.45 kg/m   General:   Alert and oriented. No distress noted. Pleasant and cooperative.  Head:  Normocephalic and atraumatic. Eyes:  Conjuctiva clear without scleral icterus. Mouth:  Oral mucosa pink and moist. Good dentition. No lesions. Abdomen:  +BS, soft, non-distended. generalized tenderness throughout. No rebound or guarding. No HSM or masses noted. Rectal: deferred Msk:  Symmetrical without gross deformities. Normal posture. Extremities:  Without edema. Neurologic:  Alert and  oriented x4 Psych:  Alert and cooperative. Normal mood and affect.  Assessment & Plan  Courtney Marshall is a 68 y.o. female presenting today with constipation and bloating and LLQ abdominal pain.      Chronic constipation Chronic constipation with left lower quadrant cramping pain, bloating, and sensation of incomplete  evacuation. Symptoms persist, affecting quality of life, with inconsistent bowel movements ranging from Bristol 4-7 with incomplete evacuation. Linzess at mild dose was effective but caused diarrhea but did not get through washout period. Current management with Miralax is insufficient. History of diverticulitis in 2019. Last colonoscopy in 2019 after diverticulitis - 10-year repeat recommended. - Start Linzess 72 mcg once daily in  the morning - samples provided. - Initiate psyllium fiber supplementation, starting with 2-3 tablets daily and increasing as tolerated. - Consider a mini bowel purge with Miralax if symptoms persist despite Linzess and fiber. - Adjust Linzess dosage if necessary, based on symptoms. - Avoid routine use of magnesium citrate; consider only for occasional use. - Consider colonoscopy if symptoms do not improve or if recurrent diverticulitis is suspected.  Gastroesophageal reflux disease (GERD) GERD managed with Protonix  once daily. Occasional breakthrough symptoms, particularly at night. No recent episodes of melena or significant family history of colon cancer or polyps. - Continue Protonix  40 mg once daily. - Use Pepcid 10- 20 mg at night for breakthrough symptoms, either 30 minutes before dinner or an hour before bed. - GERD diet reinforced     Follow up   Follow up 2 months.    Charmaine Melia, MSN, FNP-BC, AGACNP-BC [facility]

## 2024-01-26 ENCOUNTER — Ambulatory Visit: Admitting: Gastroenterology

## 2024-01-26 ENCOUNTER — Encounter: Payer: Self-pay | Admitting: Gastroenterology

## 2024-01-26 ENCOUNTER — Other Ambulatory Visit: Payer: Self-pay

## 2024-01-26 VITALS — BP 126/83 | HR 76 | Temp 98.6°F | Ht 64.0 in | Wt 183.2 lb

## 2024-01-26 DIAGNOSIS — K5900 Constipation, unspecified: Secondary | ICD-10-CM | POA: Diagnosis not present

## 2024-01-26 DIAGNOSIS — Z8719 Personal history of other diseases of the digestive system: Secondary | ICD-10-CM | POA: Diagnosis not present

## 2024-01-26 DIAGNOSIS — K219 Gastro-esophageal reflux disease without esophagitis: Secondary | ICD-10-CM

## 2024-01-26 DIAGNOSIS — K5904 Chronic idiopathic constipation: Secondary | ICD-10-CM

## 2024-01-26 MED ORDER — LINACLOTIDE 72 MCG PO CAPS: 72.0000 ug | ORAL_CAPSULE | Freq: Every day | ORAL | Status: AC

## 2024-01-26 NOTE — Progress Notes (Signed)
 Medication Samples have been provided to the patient.  Drug name: LINZESS       Strength: 72 MCG        Qty: 3  LOT: [id number]  Exp.Date: 2028/01  Dosing instructions: 1 CAPSULE 30 MINUTES PRIOR TO BREAKFAST  The patient has been instructed regarding the correct time, dose, and frequency of taking this medication, including desired effects and most common side effects.   Arihana Ambrocio I Liviana Mills 12:45 PM 01/26/2024

## 2024-01-26 NOTE — Patient Instructions (Signed)
 I am providing you samples of Linzess 72 mcg capsules.   How to take Linzess: Once a day every day on empty stomach, at least 30 minutes before your first meal of the day. It is best to keep medications at a stable temperature Medication is best kept in its original bottle with the disket present.  It is a medication that is meant for everyday use and not to be used as needed.   What to expect: Constipation relief is typically felt in about 1 week Relief of abdominal pain, discomfort, and bloating begins in about 1 week with symptoms typically improving over 12 weeks and beyond. Diarrhea is most common side effect and typically begins within the first 2 weeks and can take 2-4 weeks to resolve It would be helpful to begin treatment over the weekend or when you can be closer to a bathroom   You can go to [email or web address] for patient support and sign up for daily medication reminders.  Please start a fiber supplement - metamucil or generic (psyllium).       If you take the powder you should take 1 tablespoon once daily in at least 8 oz of water  or start with 2-3 capsules once daily.    Mini bowel purge: Miralax 1 capsul in 8 oz water  every hour for 5 hours.  Complete this if after 6 weeks still not feeling cleaned out or having fullness.   Follow a high fiber diet to the best of your ability. Fruits like apples, kiwi and veggies like carrots, beans, spinach etc are high in fiber. I attached some info to the back of your paperwork today.   For reflux breakthrough you should start famotidine 10 or 20 mg nightly as needed.   Follow a GERD diet:  Avoid fried, fatty, greasy, spicy, citrus foods, tomato based products.  Avoid caffeine and carbonated beverages. Avoid chocolate. Try eating 4-6 small meals a day rather than 3 large meals. Do not eat within 3 hours of laying down. Prop head of bed up on wood or bricks to create a 6 inch incline.   Follow up in 2 months.   It was a  pleasure to see you today. I want to create trusting relationships with patients. If you receive a survey regarding your visit,  I greatly appreciate you taking time to fill this out on paper or through your MyChart. I value your feedback.  Charmaine Melia, MSN, FNP-BC, AGACNP-BC [facility]

## 2024-02-27 ENCOUNTER — Emergency Department (HOSPITAL_COMMUNITY)
Admission: EM | Admit: 2024-02-27 | Discharge: 2024-02-27 | Disposition: A | Discharge status: Home / Self Care | Source: Ambulatory Visit | Attending: Emergency Medicine | Admitting: Emergency Medicine

## 2024-02-27 ENCOUNTER — Other Ambulatory Visit: Payer: Self-pay

## 2024-02-27 ENCOUNTER — Encounter (HOSPITAL_COMMUNITY): Payer: Self-pay

## 2024-02-27 ENCOUNTER — Ambulatory Visit
Admission: RE | Admit: 2024-02-27 | Discharge: 2024-02-27 | Disposition: A | Discharge status: Home / Self Care | Source: Ambulatory Visit | Attending: Nurse Practitioner | Admitting: Nurse Practitioner

## 2024-02-27 VITALS — BP 130/89 | HR 100 | Temp 99.1°F | Resp 20

## 2024-02-27 DIAGNOSIS — H538 Other visual disturbances: Secondary | ICD-10-CM | POA: Insufficient documentation

## 2024-02-27 DIAGNOSIS — R059 Cough, unspecified: Secondary | ICD-10-CM | POA: Diagnosis not present

## 2024-02-27 DIAGNOSIS — Z79899 Other long term (current) drug therapy: Secondary | ICD-10-CM | POA: Diagnosis not present

## 2024-02-27 DIAGNOSIS — J069 Acute upper respiratory infection, unspecified: Secondary | ICD-10-CM | POA: Diagnosis not present

## 2024-02-27 DIAGNOSIS — H539 Unspecified visual disturbance: Secondary | ICD-10-CM

## 2024-02-27 DIAGNOSIS — I10 Essential (primary) hypertension: Secondary | ICD-10-CM | POA: Diagnosis not present

## 2024-02-27 LAB — POC COVID19/FLU A&B COMBO
Covid Antigen, POC: NEGATIVE
Influenza A Antigen, POC: NEGATIVE
Influenza B Antigen, POC: NEGATIVE

## 2024-02-27 MED ORDER — FLUTICASONE PROPIONATE 50 MCG/ACT NA SUSP: 2.0000 | Freq: Every day | NASAL | 0 refills | Status: AC

## 2024-02-27 MED ORDER — PROMETHAZINE-DM 6.25-15 MG/5ML PO SYRP: 5.0000 mL | ORAL_SOLUTION | Freq: Four times a day (QID) | ORAL | 0 refills | Status: AC | PRN

## 2024-02-27 NOTE — ED Triage Notes (Signed)
 Pt reports that she has had headache, dizziness, and cough for the past 3 days. Pt reports that 2 days ago she coughed real hard and had sudden rt eye black veil in her right eye. Pt was seen at UC this morning and states that they sent her here to have her either problem further evaluated.

## 2024-02-27 NOTE — ED Notes (Addendum)
 Covid/Flu pending at time of pt discharge.

## 2024-02-27 NOTE — ED Triage Notes (Addendum)
 Pt reports productive cough, nasal congestion, sore throat, body aches for last several days. Denies any known fevers. Has tried dayquil, chloratab, tylenol.   Pt also reports a black lacy veil to right eye x2 days. Inquiring if related to coughing so hard. Wears glasses at baseline.

## 2024-02-27 NOTE — ED Provider Notes (Signed)
 [facility]    CSN: [id number] Arrival date & time: 02/27/24  1000      History   Chief Complaint Chief Complaint  Patient presents with   Cough    HPI Courtney Marshall is a 68 y.o. female.   The history is provided by the patient.   Patient presents for complaints of productive cough, nasal congestion, sore throat, and bodyaches for the past several days.  She denies fever, chills, headache, ear pain, ear drainage, wheezing, difficulty breathing, chest pain, abdominal pain, nausea, vomiting, diarrhea, or rash.  States she has taken over-the-counter medications for her symptoms with minimal relief.  Patient also presents for complaints of a black lacy veil to the right eye that has been present for the past 2 days.  She denies injury or trauma, eye swelling, eye pain, or drainage.  She reports that she has had a headache since her symptoms started, but contributes that to her sinus symptoms.  She reports that she wears glasses, reports history of floaters in the left eye.   Past Medical History:  Diagnosis Date   Anxiety    Arthritis    Diverticulitis    Elevated hemoglobin A1c    GERD (gastroesophageal reflux disease)    Hyperlipidemia    Hypertension    PMB (postmenopausal bleeding) 11/03/2014   Had bleeding last year, after trying HRT by [facility], will get US    Trouble in sleeping 11/03/2014   Vaginal dryness 11/03/2014    Patient Active Problem List   Diagnosis Date Noted   Hypertension 02/07/2022   Encounter for screening fecal occult blood testing 02/07/2022   Primary osteoarthritis of both knees 12/25/2020   GERD (gastroesophageal reflux disease) 06/28/2019   Dysphagia 06/28/2019   Encounter for gynecological examination with Papanicolaou smear of cervix 04/22/2018   Screening for colorectal cancer 04/22/2018   Special screening for malignant neoplasms, colon    Diverticulosis of large intestine without diverticulitis    Family history of  osteoporosis 10/15/2016   Postmenopausal 10/15/2016   PMB (postmenopausal bleeding) 11/03/2014   Trouble in sleeping 11/03/2014   Vaginal dryness 11/03/2014    Past Surgical History:  Procedure Laterality Date   CESAREAN SECTION     COLONOSCOPY N/A 12/16/2017   Procedure: COLONOSCOPY;  Surgeon: Mavis Anes, MD;  [facility];  Service: Gastroenterology;  Laterality: N/A;   ESOPHAGOGASTRODUODENOSCOPY (EGD) WITH PROPOFOL  N/A 07/01/2019   Procedure: ESOPHAGOGASTRODUODENOSCOPY (EGD) WITH PROPOFOL ;  Surgeon: Shaaron Lamar HERO, MD;  [facility]: [facility];  Service: Endoscopy;  Laterality: N/A;  2:45pm   MALONEY DILATION N/A 07/01/2019   Procedure: AGAPITO DILATION;  Surgeon: Shaaron Lamar HERO, MD;  [facility];  Service: Endoscopy;  Laterality: N/A;   REPLACEMENT TOTAL KNEE BILATERAL     TONSILLECTOMY     TUBAL LIGATION      OB History     Gravida  3   Para  3   Term  3   Preterm      AB      Living  3      SAB      IAB      Ectopic      Multiple      Live Births  3            Home Medications    Prior to Admission medications   Medication Sig Start Date End Date Taking? Authorizing Provider  buPROPion (WELLBUTRIN XL) 150 MG 24 hr tablet  Take 150 mg by mouth daily. 04/12/20   [provider]  calcium citrate-vitamin D (CITRACAL+D) 315-200 MG-UNIT tablet Take 1 tablet by mouth daily.    [provider]  escitalopram (LEXAPRO) 5 MG tablet Take 5 mg by mouth daily.    [provider]  linaclotide  (LINZESS ) 72 MCG capsule Take 1 capsule (72 mcg total) by mouth daily before breakfast. 01/26/24   Kennedy Charmaine CROME, NP  losartan-hydrochlorothiazide (HYZAAR) 100-25 MG tablet Take 1 tablet by mouth daily.     [provider]  magnesium 30 MG tablet Take 30 mg by mouth 2 (two) times daily.    [provider]  ondansetron  (ZOFRAN -ODT) 4 MG disintegrating tablet Take 1 tablet (4 mg total) by mouth every 8  (eight) hours as needed. 03/20/23   Leath-Warren, Etta PARAS, NP  pantoprazole  (PROTONIX ) 40 MG tablet TAKE (1) TABLET BY MOUTH TWICE DAILY. 02/28/20   Ezzard Sonny RAMAN, PA-C    Family History Family History  Problem Relation Age of Onset   Osteoporosis Mother    Heart disease Father    Hypertension Father    Hyperlipidemia Father    Osteoporosis Sister    Osteoporosis Maternal Grandmother    Other Maternal Grandfather        hit by train   Heart attack Paternal Grandfather    Colon cancer Neg Hx    Colon polyps Neg Hx     Social History Social History   Tobacco Use   Smoking status: Never   Smokeless tobacco: Never  Vaping Use   Vaping status: Never Used  Substance Use Topics   Alcohol use: No   Drug use: No     Allergies   Lisinopril and Sulfa antibiotics   Review of Systems Review of Systems Per HPI  Physical Exam Triage Vital Signs ED Triage Vitals  Encounter Vitals Group     BP 02/27/24 1011 130/89     Girls Systolic BP Percentile --      Girls Diastolic BP Percentile --      Boys Systolic BP Percentile --      Boys Diastolic BP Percentile --      Pulse Rate 02/27/24 1011 100     Resp 02/27/24 1011 20     Temp 02/27/24 1011 99.1 F (37.3 C)     Temp Source 02/27/24 1011 Oral     SpO2 02/27/24 1011 95 %     Weight --      Height --      Head Circumference --      Peak Flow --      Pain Score 02/27/24 1010 3     Pain Loc --      Pain Education --      Exclude from Growth Chart --    No data found.  Updated Vital Signs BP 130/89 (BP Location: Right Arm)   Pulse 100   Temp 99.1 F (37.3 C) (Oral)   Resp 20   SpO2 95%   Visual Acuity Right Eye Distance:   Left Eye Distance:   Bilateral Distance:    Right Eye Near:   Left Eye Near:    Bilateral Near:     Physical Exam Vitals and nursing note reviewed.  Constitutional:      General: She is not in acute distress.    Appearance: Normal appearance.  HENT:     Head: Normocephalic.      Right Ear: Tympanic membrane, ear canal and external ear  normal.     Left Ear: Tympanic membrane, ear canal and external ear normal.     Nose: Congestion present.  Eyes:     General: Lids are normal. Vision grossly intact. No visual field deficit.    Extraocular Movements: Extraocular movements intact.     Right eye: Normal extraocular motion and no nystagmus.     Conjunctiva/sclera:     Right eye: Right conjunctiva is not injected. No chemosis.    Pupils: Pupils are equal, round, and reactive to light.  Cardiovascular:     Rate and Rhythm: Normal rate and regular rhythm.     Pulses: Normal pulses.     Heart sounds: Normal heart sounds.  Pulmonary:     Effort: Pulmonary effort is normal. No respiratory distress.     Breath sounds: Normal breath sounds. No stridor. No wheezing, rhonchi or rales.  Musculoskeletal:     Cervical back: Normal range of motion.  Skin:    General: Skin is warm and dry.  Neurological:     General: No focal deficit present.     Mental Status: She is alert and oriented to person, place, and time.  Psychiatric:        Mood and Affect: Mood normal.        Behavior: Behavior normal.      UC Treatments / Results  Labs (all labs ordered are listed, but only abnormal results are displayed) Labs Reviewed  POC COVID19/FLU A&B COMBO    EKG   Radiology No results found.  Procedures Procedures (including critical care time)  Medications Ordered in UC Medications - No data to display  Initial Impression / Assessment and Plan / UC Course  I have reviewed the triage vital signs and the nursing notes.  Pertinent labs & imaging results that were available during my care of the patient were reviewed by me and considered in my medical decision making (see chart for details).  COVID/flu test was negative.  Patient's upper respiratory symptoms consistent with viral etiology.  Will provide symptomatic treatment with fluticasone  50 mcg nasal spray and  Promethazine  DM for the cough.  Patient also complains of a black lacy veil over the right eye that is been present for the past 2 days.  She denies injury or trauma to the eye, pain, or swelling.  Given the presentation of the symptoms, differential diagnoses include retinal detachment, TIA,  *** Final Clinical Impressions(s) / UC Diagnoses   Final diagnoses:  Cough, unspecified type   Discharge Instructions   None    ED Prescriptions   None    PDMP not reviewed this encounter.

## 2024-02-27 NOTE — Discharge Instructions (Signed)
 You were evaluated in the emergency room for vision change.  Your symptoms are most concerning for retinal detachment.  Please present to the ophthalmology office in [facility] for further evaluation.  [phone] [address] [place], [place], [place] [address]

## 2024-02-27 NOTE — ED Notes (Signed)
 Patient is being discharged from the Urgent Care and sent to the Emergency Department via POV . Per provider, patient is in need of higher level of care due to visual disturbance. Patient is aware and verbalizes understanding of plan of care.  Vitals:   02/27/24 1011  BP: 130/89  Pulse: 100  Resp: 20  Temp: 99.1 F (37.3 C)  SpO2: 95%

## 2024-02-27 NOTE — ED Provider Notes (Signed)
 [facility] Provider Note   CSN: [id number] Arrival date & time: 02/27/24  1031     Patient presents with: Eye Problem   Courtney Marshall is a 68 y.o. female history of hypertension, hyperlipidemia presents with complaints of vision changes.  Reports that Courtney Marshall has had a nonproductive cough over the past few days with sinus congestion.  States that Courtney Marshall coughed hard 2 days ago and suddenly had a  black veil in her right eye.  It has persisted.  Denies any pain.  Denies any significant headache at this time.  No extremity weakness, numbness, difficulty speaking or ambulating.  No chest pain or shortness of breath reported.    HPI    Past Medical History:  Diagnosis Date   Anxiety    Arthritis    Diverticulitis    Elevated hemoglobin A1c    GERD (gastroesophageal reflux disease)    Hyperlipidemia    Hypertension    PMB (postmenopausal bleeding) 11/03/2014   Had bleeding last year, after trying HRT by [facility], will get US    Trouble in sleeping 11/03/2014   Vaginal dryness 11/03/2014   Past Surgical History:  Procedure Laterality Date   CESAREAN SECTION     COLONOSCOPY N/A 12/16/2017   Procedure: COLONOSCOPY;  Surgeon: Mavis Anes, MD;  [facility];  Service: Gastroenterology;  Laterality: N/A;   ESOPHAGOGASTRODUODENOSCOPY (EGD) WITH PROPOFOL  N/A 07/01/2019   Procedure: ESOPHAGOGASTRODUODENOSCOPY (EGD) WITH PROPOFOL ;  Surgeon: Shaaron Lamar HERO, MD;  [facility];  Service: Endoscopy;  Laterality: N/A;  2:45pm   MALONEY DILATION N/A 07/01/2019   Procedure: AGAPITO DILATION;  Surgeon: Shaaron Lamar HERO, MD;  [facility];  Service: Endoscopy;  Laterality: N/A;   REPLACEMENT TOTAL KNEE BILATERAL     TONSILLECTOMY     TUBAL LIGATION       Prior to Admission medications   Medication Sig Start Date End Date Taking? Authorizing Provider  buPROPion (WELLBUTRIN XL) 150 MG 24 hr tablet Take 150 mg  by mouth daily. 04/12/20  Yes [provider]  calcium citrate-vitamin D (CITRACAL+D) 315-200 MG-UNIT tablet Take 1 tablet by mouth daily.   Yes [provider]  escitalopram (LEXAPRO) 5 MG tablet Take 5 mg by mouth daily.   Yes [provider]  fluticasone  (FLONASE ) 50 MCG/ACT nasal spray Place 2 sprays into both nostrils daily. 02/27/24  Yes Leath-Warren, Etta PARAS, NP  losartan-hydrochlorothiazide (HYZAAR) 100-25 MG tablet Take 1 tablet by mouth daily.    Yes [provider]  magnesium 30 MG tablet Take 30 mg by mouth 2 (two) times daily.   Yes [provider]  pantoprazole  (PROTONIX ) 40 MG tablet TAKE (1) TABLET BY MOUTH TWICE DAILY. 02/28/20  Yes Ezzard Sonny GORMAN, PA-C  Vitamin D, Ergocalciferol, 50000 units CAPS Take 1 Capsule by mouth once a week for 12 weeks, then take 2000 units otc daily.   Yes [provider]  linaclotide  (LINZESS ) 72 MCG capsule Take 1 capsule (72 mcg total) by mouth daily before breakfast. Patient not taking: Reported on 02/27/2024 01/26/24   Kennedy Charmaine CROME, NP  ondansetron  (ZOFRAN -ODT) 4 MG disintegrating tablet Take 1 tablet (4 mg total) by mouth every 8 (eight) hours as needed. Patient not taking: Reported on 02/27/2024 03/20/23   Leath-Warren, Etta PARAS, NP  promethazine -dextromethorphan (PROMETHAZINE -DM) 6.25-15 MG/5ML syrup Take 5 mLs by mouth 4 (four) times daily as needed. Patient not taking: Reported on 02/27/2024 02/27/24  Leath-Warren, Etta PARAS, NP    Allergies: Lisinopril and Sulfa antibiotics    Review of Systems  Eyes:  Positive for visual disturbance.    Updated Vital Signs BP (!) 174/96 (BP Location: Left Arm)   Pulse 97   Temp 98.2 F (36.8 C) (Oral)   Resp 13   Ht 5' 4 (1.626 m)   Wt 81.6 kg   SpO2 98%   BMI 30.90 kg/m   Physical Exam Vitals and nursing note reviewed.  Constitutional:      General: Courtney Marshall is not in acute distress.    Appearance: Courtney Marshall is well-developed.  HENT:      Head: Normocephalic and atraumatic.  Eyes:     Extraocular Movements: Extraocular movements intact.     Conjunctiva/sclera: Conjunctivae normal.     Pupils: Pupils are equal, round, and reactive to light.  Cardiovascular:     Rate and Rhythm: Normal rate and regular rhythm.     Heart sounds: No murmur heard. Pulmonary:     Effort: Pulmonary effort is normal. No respiratory distress.     Breath sounds: Normal breath sounds.  Abdominal:     Palpations: Abdomen is soft.     Tenderness: There is no abdominal tenderness.  Musculoskeletal:        General: No swelling.     Cervical back: Neck supple.  Skin:    General: Skin is warm and dry.     Capillary Refill: Capillary refill takes less than 2 seconds.  Neurological:     Mental Status: Courtney Marshall is alert.     Comments: Patient is alert and oriented. There is no abnormal phonation. Symmetric smile without facial droop. Moves all extremities spontaneously. 5/5 strength in upper and lower extremities. . No sensation deficit. There is no nystagmus. Coordination intact with finger to nose     Psychiatric:        Mood and Affect: Mood normal.     (all labs ordered are listed, but only abnormal results are displayed) Labs Reviewed - No data to display  EKG: EKG Interpretation Date/Time:  Friday February 27 2024 10:41:54 EST Ventricular Rate:  102 PR Interval:  133 QRS Duration:  82 QT Interval:  332 QTC Calculation: 433 R Axis:   21  Text Interpretation: Sinus tachycardia Low voltage, precordial leads Since last tracing rate faster Confirmed by Dean Clarity [phone]) on 02/27/2024 10:45:40 AM  Radiology: No results found.   Procedures   Medications Ordered in the ED - No data to display  Clinical Course as of 02/27/24 1142  Fri Feb 27, 2024  1105 Patient with history of hypertension evaluated for painless black veil in her right eye that suddenly occurred 2 days ago while coughing.  Describes sinus congestion.  Otherwise  no other concerning neurological symptoms.  Upon arrival Courtney Marshall is hypertensive otherwise hemodynamically stable.  On exam her lungs are clear, EOMI, PERRL, no carotid bruits.  Clinical picture most concerning for retinal versus vitreous detachment.  Ophthalmology consulted. [JT]  1140 Discussed patient with Dr. Austin, recommended sending patient over to office today for evaluation. [JT]    Clinical Course User Index [JT] Donnajean Lynwood DEL, PA-C                                 Medical Decision Making  This patient presents to the ED with chief complaint(s) of vision problems .  The complaint involves an extensive differential diagnosis and  also carries with it a high risk of complications and morbidity.   Pertinent past medical history as listed in HPI  The differential diagnosis includes  Retinal detachment, vitreal detachment, CVA, TIA, subarachnoid hemorrhage, vertebral dissection, carotid stenosis  Additional history obtained: Records reviewed Care Everywhere/External Records  Disposition:   Patient will be discharged home and will be seen in office today.   Social Determinants of Health:   none  This note was dictated with voice recognition software.  Despite best efforts at proofreading, errors may have occurred which can change the documentation meaning.       Final diagnoses:  Vision changes    ED Discharge Orders     None          Beatric Fulop H, PA-C 02/27/24 1143    Haviland, Julie, MD 02/27/24 1438

## 2024-02-28 NOTE — Discharge Instructions (Signed)
Please go to the emergency department for further evaluation.

## 2024-03-29 ENCOUNTER — Ambulatory Visit: Admitting: Gastroenterology

## 2024-04-16 ENCOUNTER — Other Ambulatory Visit (HOSPITAL_COMMUNITY): Payer: Self-pay | Admitting: Family Medicine

## 2024-04-16 DIAGNOSIS — Z1231 Encounter for screening mammogram for malignant neoplasm of breast: Secondary | ICD-10-CM

## 2024-04-22 ENCOUNTER — Ambulatory Visit (HOSPITAL_COMMUNITY)

## 2024-04-28 ENCOUNTER — Ambulatory Visit (HOSPITAL_COMMUNITY)
Admission: RE | Admit: 2024-04-28 | Discharge: 2024-04-28 | Disposition: A | Discharge status: Home / Self Care | Source: Ambulatory Visit | Attending: Family Medicine | Admitting: Family Medicine

## 2024-04-28 DIAGNOSIS — Z1231 Encounter for screening mammogram for malignant neoplasm of breast: Secondary | ICD-10-CM | POA: Insufficient documentation

## 2024-05-31 ENCOUNTER — Ambulatory Visit: Admitting: Gastroenterology
